# Patient Record
Sex: Male | Born: 1985 | Race: White | Hispanic: No | Marital: Married | Smoking: Never smoker
Health system: Southern US, Community
[De-identification: ages and names within clinical notes are randomized; demographics above are authoritative.]

## PROBLEM LIST (undated history)

## (undated) DIAGNOSIS — G4730 Sleep apnea, unspecified: Secondary | ICD-10-CM

## (undated) DIAGNOSIS — T7840XA Allergy, unspecified, initial encounter: Secondary | ICD-10-CM

## (undated) DIAGNOSIS — I10 Essential (primary) hypertension: Secondary | ICD-10-CM

## (undated) DIAGNOSIS — Z8489 Family history of other specified conditions: Secondary | ICD-10-CM

## (undated) DIAGNOSIS — F419 Anxiety disorder, unspecified: Secondary | ICD-10-CM

## (undated) DIAGNOSIS — F411 Generalized anxiety disorder: Secondary | ICD-10-CM

## (undated) HISTORY — DX: Anxiety disorder, unspecified: F41.9

## (undated) HISTORY — PX: WISDOM TOOTH EXTRACTION: SHX21

## (undated) HISTORY — PX: TYMPANOSTOMY TUBE PLACEMENT: SHX32

## (undated) HISTORY — DX: Allergy, unspecified, initial encounter: T78.40XA

## (undated) HISTORY — PX: EAR TUBE REMOVAL: SHX1486

## (undated) HISTORY — DX: Essential (primary) hypertension: I10

## (undated) HISTORY — PX: ADENOIDECTOMY: SUR15

---
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
## 2013-10-04 DIAGNOSIS — F32A Depression, unspecified: Secondary | ICD-10-CM | POA: Insufficient documentation

## 2013-10-04 DIAGNOSIS — F411 Generalized anxiety disorder: Secondary | ICD-10-CM | POA: Insufficient documentation

## 2020-05-10 ENCOUNTER — Other Ambulatory Visit: Payer: Self-pay

## 2020-05-17 ENCOUNTER — Other Ambulatory Visit: Payer: Self-pay | Admitting: Otolaryngology

## 2020-05-21 LAB — SURGICAL PATHOLOGY

## 2020-06-11 DIAGNOSIS — M25512 Pain in left shoulder: Secondary | ICD-10-CM | POA: Diagnosis not present

## 2020-06-11 DIAGNOSIS — M25511 Pain in right shoulder: Secondary | ICD-10-CM | POA: Diagnosis not present

## 2020-06-19 DIAGNOSIS — J301 Allergic rhinitis due to pollen: Secondary | ICD-10-CM | POA: Diagnosis not present

## 2020-06-22 DIAGNOSIS — J301 Allergic rhinitis due to pollen: Secondary | ICD-10-CM | POA: Diagnosis not present

## 2020-06-26 DIAGNOSIS — M25512 Pain in left shoulder: Secondary | ICD-10-CM | POA: Diagnosis not present

## 2020-06-26 DIAGNOSIS — M25511 Pain in right shoulder: Secondary | ICD-10-CM | POA: Diagnosis not present

## 2020-06-28 DIAGNOSIS — J301 Allergic rhinitis due to pollen: Secondary | ICD-10-CM | POA: Diagnosis not present

## 2020-07-02 DIAGNOSIS — J301 Allergic rhinitis due to pollen: Secondary | ICD-10-CM | POA: Diagnosis not present

## 2020-07-05 DIAGNOSIS — J301 Allergic rhinitis due to pollen: Secondary | ICD-10-CM | POA: Diagnosis not present

## 2020-07-09 DIAGNOSIS — J301 Allergic rhinitis due to pollen: Secondary | ICD-10-CM | POA: Diagnosis not present

## 2020-07-10 DIAGNOSIS — J301 Allergic rhinitis due to pollen: Secondary | ICD-10-CM | POA: Diagnosis not present

## 2020-07-12 DIAGNOSIS — J301 Allergic rhinitis due to pollen: Secondary | ICD-10-CM | POA: Diagnosis not present

## 2020-07-16 DIAGNOSIS — M25311 Other instability, right shoulder: Secondary | ICD-10-CM | POA: Diagnosis not present

## 2020-07-16 DIAGNOSIS — M7522 Bicipital tendinitis, left shoulder: Secondary | ICD-10-CM | POA: Diagnosis not present

## 2020-07-19 DIAGNOSIS — J301 Allergic rhinitis due to pollen: Secondary | ICD-10-CM | POA: Diagnosis not present

## 2020-07-23 DIAGNOSIS — J301 Allergic rhinitis due to pollen: Secondary | ICD-10-CM | POA: Diagnosis not present

## 2020-07-26 DIAGNOSIS — J301 Allergic rhinitis due to pollen: Secondary | ICD-10-CM | POA: Diagnosis not present

## 2020-07-30 ENCOUNTER — Encounter: Payer: Self-pay | Admitting: Emergency Medicine

## 2020-07-30 ENCOUNTER — Other Ambulatory Visit: Payer: Self-pay

## 2020-07-30 ENCOUNTER — Emergency Department
Admission: EM | Admit: 2020-07-30 | Discharge: 2020-07-30 | Disposition: A | Discharge status: Home / Self Care | Payer: Federal, State, Local not specified - PPO | Attending: Emergency Medicine | Admitting: Emergency Medicine

## 2020-07-30 DIAGNOSIS — R42 Dizziness and giddiness: Secondary | ICD-10-CM | POA: Diagnosis not present

## 2020-07-30 DIAGNOSIS — R0989 Other specified symptoms and signs involving the circulatory and respiratory systems: Secondary | ICD-10-CM | POA: Diagnosis not present

## 2020-07-30 DIAGNOSIS — R198 Other specified symptoms and signs involving the digestive system and abdomen: Secondary | ICD-10-CM

## 2020-07-30 DIAGNOSIS — R21 Rash and other nonspecific skin eruption: Secondary | ICD-10-CM | POA: Diagnosis not present

## 2020-07-30 DIAGNOSIS — L259 Unspecified contact dermatitis, unspecified cause: Secondary | ICD-10-CM | POA: Diagnosis not present

## 2020-07-30 HISTORY — DX: Allergy, unspecified, initial encounter: T78.40XA

## 2020-07-30 HISTORY — DX: Generalized anxiety disorder: F41.1

## 2020-07-30 LAB — CBC
HCT: 42.5 % (ref 39.0–52.0)
Hemoglobin: 14.7 g/dL (ref 13.0–17.0)
MCH: 29.6 pg (ref 26.0–34.0)
MCHC: 34.6 g/dL (ref 30.0–36.0)
MCV: 85.5 fL (ref 80.0–100.0)
Platelets: 360 10*3/uL (ref 150–400)
RBC: 4.97 MIL/uL (ref 4.22–5.81)
RDW: 12.7 % (ref 11.5–15.5)
WBC: 12.5 10*3/uL — ABNORMAL HIGH (ref 4.0–10.5)
nRBC: 0 % (ref 0.0–0.2)

## 2020-07-30 LAB — BASIC METABOLIC PANEL
Anion gap: 6 (ref 5–15)
BUN: 10 mg/dL (ref 6–20)
CO2: 27 mmol/L (ref 22–32)
Calcium: 8.9 mg/dL (ref 8.9–10.3)
Chloride: 105 mmol/L (ref 98–111)
Creatinine, Ser: 0.89 mg/dL (ref 0.61–1.24)
GFR, Estimated: >60 mL/min (ref >60)
Glucose, Bld: 99 mg/dL (ref 70–99)
Potassium: 3.8 mmol/L (ref 3.5–5.1)
Sodium: 138 mmol/L (ref 135–145)

## 2020-07-30 MED ORDER — CETIRIZINE HCL 10 MG PO TABS: 10.0000 mg | ORAL_TABLET | Freq: Every day | ORAL | 2 refills | Status: DC

## 2020-07-30 MED ORDER — OMEPRAZOLE 20 MG PO CPDR: 20.0000 mg | DELAYED_RELEASE_CAPSULE | Freq: Every day | ORAL | 1 refills | Status: DC

## 2020-07-30 NOTE — ED Triage Notes (Signed)
Pt via POV from home. Pt recently moved to [facility] in July and since then has had problems with nasal congestion and allergies. In mid-August pt started allergy testing. Pt states that on Friday night pt noticed a rash on his R hip. Pt says since then he has had random areas of itchy rashes that would pop up for approx 20 mins and this weekend pt has been feeling lightheadness that started this weekend. Pt is A&Ox4 and NAD.

## 2020-07-30 NOTE — ED Provider Notes (Signed)
[facility] Emergency Department Provider Note   ____________________________________________   First MD Initiated Contact with Patient 07/30/20 1345     (approximate)  I have reviewed the triage vital signs and the nursing notes.   HISTORY  Chief Complaint Rash and Dizziness    HPI Richard Donovan is a 34 y.o. male with past medical history of seasonal allergies and generalized anxiety disorder who presents to the ED complaining of rash and lightheadedness. Patient reports that he has been dealing with about 1 week of intermittent areas of red itchy rash that can pop up on various parts of his body. The rash for started while he was vacationing in [place], since he has gotten home he has washed all of his clothes and showered multiple times. He continues to have spots of red itchy and raised rash, but spots are smaller than they had been. He does follow with an allergist for similar allergic reactions and they have told him it could be a contact dermatitis but they are unsure of his trigger. He additionally reports a feeling of swelling in the back of his throat that has been ongoing for about 1 month. He states he has to work harder than usual in order to swallow and feels like something is stuck in the back of his throat. He denies any difficulty breathing and has been tolerating liquids and solids without difficulty. He has seen an ENT for this problem with unremarkable work-up, has never been seen by GI. His wife does state that he seems to have breath-holding episodes at night. He additionally has been feeling increasingly lightheaded and dizzy over the past couple of days, but denies any chest pain or shortness of breath.        Past Medical History:  Diagnosis Date  . Allergies   . Generalized anxiety disorder     There are no problems to display for this patient.   Past Surgical History:  Procedure Laterality Date  . ADENOIDECTOMY      Prior to  Admission medications   Medication Sig Start Date End Date Taking? Authorizing Provider  cetirizine (ZYRTEC ALLERGY) 10 MG tablet Take 1 tablet (10 mg total) by mouth daily. 07/30/20 07/30/21  Chesley Noon, MD  omeprazole (PRILOSEC) 20 MG capsule Take 1 capsule (20 mg total) by mouth daily. 07/30/20 07/30/21  Chesley Noon, MD    Allergies Sudafed [pseudoephedrine]  No family history on file.  Social History Social History   Tobacco Use  . Smoking status: Never Smoker  . Smokeless tobacco: Never Used  Vaping Use  . Vaping Use: Never used  Substance Use Topics  . Alcohol use: Not Currently  . Drug use: Not Currently    Review of Systems  Constitutional: No fever/chills Eyes: No visual changes. ENT: No sore throat. Cardiovascular: Denies chest pain. Respiratory: Denies shortness of breath. Gastrointestinal: No abdominal pain.  No nausea, no vomiting.  No diarrhea.  No constipation. Positive for difficulty swallowing. Genitourinary: Negative for dysuria. Musculoskeletal: Negative for back pain. Skin: Positive for rash. Neurological: Negative for headaches, focal weakness or numbness.  ____________________________________________   PHYSICAL EXAM:  VITAL SIGNS: ED Triage Vitals  Enc Vitals Group     BP 07/30/20 1304 (!) 141/85     Pulse Rate 07/30/20 1304 83     Resp 07/30/20 1304 20     Temp 07/30/20 1304 98.9 F (37.2 C)     Temp Source 07/30/20 1304 Oral     SpO2 07/30/20 1304  99 %     Weight 07/30/20 1305 230 lb (104.3 kg)     Height 07/30/20 1305 6\' 1"  (1.854 m)     Head Circumference --      Peak Flow --      Pain Score 07/30/20 1305 0     Pain Loc --      Pain Edu? --      Excl. in GC? --     Constitutional: Alert and oriented. Eyes: Conjunctivae are normal. Head: Atraumatic. Nose: No congestion/rhinnorhea. Mouth/Throat: Mucous membranes are moist. Posterior oropharynx clear with no erythema or edema. Neck: Normal ROM Cardiovascular: Normal  rate, regular rhythm. Grossly normal heart sounds. Respiratory: Normal respiratory effort.  No retractions. Lungs CTAB. Gastrointestinal: Soft and nontender. No distention. Genitourinary: deferred Musculoskeletal: No lower extremity tenderness nor edema. Neurologic:  Normal speech and language. No gross focal neurologic deficits are appreciated. Skin:  Skin is warm, dry and intact. Small scattered areas of maculopapular erythematous rash to extremities. Psychiatric: Mood and affect are normal. Speech and behavior are normal.  ____________________________________________   LABS (all labs ordered are listed, but only abnormal results are displayed)  Labs Reviewed  CBC - Abnormal; Notable for the following components:      Result Value   WBC 12.5 (*)    All other components within normal limits  BASIC METABOLIC PANEL  URINALYSIS, COMPLETE (UACMP) WITH MICROSCOPIC   ____________________________________________  EKG  ED ECG REPORT I, 08/01/20, the attending physician, personally viewed and interpreted this ECG.   Date: 07/30/2020  EKG Time: 13:08  Rate: 77  Rhythm: normal sinus rhythm  Axis: RAD  Intervals:none  ST&T Change: Nonspecific T wave changes   PROCEDURES  Procedure(s) performed (including Critical Care):  Procedures   ____________________________________________   INITIAL IMPRESSION / ASSESSMENT AND PLAN / ED COURSE       34 year old male with past medical history of generalized anxiety disorder and seasonal allergies who presents to the ED complaining of intermittent rashes for the past week, feeling of something stuck in the back of his throat for the past month, and lightheadedness with dizziness for the past few days. EKG shows no evidence of arrhythmia or ischemia, labs unremarkable with no anemia or electrolyte abnormality. I doubt primary cardiac process for his dizziness and there does appear to be a component of anxiety. His posterior oropharynx  is clear and he is tolerating secretions without difficulty. He may have a component of reflux or OSA contributing to describe globus sensation. We will have him follow-up with his PCP for sleep study, start him on a PPI, and provide referral to GI. He likely has a contact dermatitis explaining his rash, we will start him on cetirizine as the Benadryl he has been taking could be contributing to lightheadedness. He is appropriate for discharge home with dermatology follow-up. He was counseled to return to the ED for new worsening symptoms, patient agrees with plan.      ____________________________________________   FINAL CLINICAL IMPRESSION(S) / ED DIAGNOSES  Final diagnoses:  Contact dermatitis, unspecified contact dermatitis type, unspecified trigger  Globus sensation     ED Discharge Orders         Ordered    omeprazole (PRILOSEC) 20 MG capsule  Daily        07/30/20 1414    cetirizine (ZYRTEC ALLERGY) 10 MG tablet  Daily        07/30/20 1414           Note:  This  document was prepared using Conservation officer, historic buildings and may include unintentional dictation errors.   Chesley Noon, MD 07/30/20 1420

## 2020-08-01 DIAGNOSIS — K219 Gastro-esophageal reflux disease without esophagitis: Secondary | ICD-10-CM | POA: Diagnosis not present

## 2020-08-07 ENCOUNTER — Ambulatory Visit: Payer: Federal, State, Local not specified - PPO

## 2020-09-17 DIAGNOSIS — Z1152 Encounter for screening for COVID-19: Secondary | ICD-10-CM | POA: Diagnosis not present

## 2020-11-26 DIAGNOSIS — G4733 Obstructive sleep apnea (adult) (pediatric): Secondary | ICD-10-CM | POA: Diagnosis not present

## 2020-11-27 DIAGNOSIS — G4733 Obstructive sleep apnea (adult) (pediatric): Secondary | ICD-10-CM | POA: Diagnosis not present

## 2020-12-27 DIAGNOSIS — G4733 Obstructive sleep apnea (adult) (pediatric): Secondary | ICD-10-CM | POA: Diagnosis not present

## 2021-01-26 DIAGNOSIS — G4733 Obstructive sleep apnea (adult) (pediatric): Secondary | ICD-10-CM | POA: Diagnosis not present

## 2021-02-26 DIAGNOSIS — G4733 Obstructive sleep apnea (adult) (pediatric): Secondary | ICD-10-CM | POA: Diagnosis not present

## 2021-03-28 DIAGNOSIS — G4733 Obstructive sleep apnea (adult) (pediatric): Secondary | ICD-10-CM | POA: Diagnosis not present

## 2021-04-03 DIAGNOSIS — G4733 Obstructive sleep apnea (adult) (pediatric): Secondary | ICD-10-CM | POA: Diagnosis not present

## 2021-04-28 DIAGNOSIS — G4733 Obstructive sleep apnea (adult) (pediatric): Secondary | ICD-10-CM | POA: Diagnosis not present

## 2021-05-29 DIAGNOSIS — G4733 Obstructive sleep apnea (adult) (pediatric): Secondary | ICD-10-CM | POA: Diagnosis not present

## 2021-06-14 DIAGNOSIS — G4733 Obstructive sleep apnea (adult) (pediatric): Secondary | ICD-10-CM | POA: Diagnosis not present

## 2021-06-14 DIAGNOSIS — J301 Allergic rhinitis due to pollen: Secondary | ICD-10-CM | POA: Diagnosis not present

## 2021-06-14 DIAGNOSIS — J3489 Other specified disorders of nose and nasal sinuses: Secondary | ICD-10-CM | POA: Diagnosis not present

## 2021-06-14 DIAGNOSIS — J320 Chronic maxillary sinusitis: Secondary | ICD-10-CM | POA: Diagnosis not present

## 2021-06-17 ENCOUNTER — Encounter: Payer: Self-pay | Admitting: Otolaryngology

## 2021-06-20 NOTE — Discharge Instructions (Signed)
[facility] ENDOSCOPIC SINUS SURGERY [facility]  What is Functional Endoscopic Sinus Surgery?  The Surgery involves making the natural openings of the sinuses larger by removing the bony partitions that separate the sinuses from the nasal cavity.  The natural sinus lining is preserved as much as possible to allow the sinuses to resume normal function after the surgery.  In some patients nasal polyps (excessively swollen lining of the sinuses) may be removed to relieve obstruction of the sinus openings.  The surgery is performed through the nose using lighted scopes, which eliminates the need for incisions on the face.  A septoplasty is a different procedure which is sometimes performed with sinus surgery.  It involves straightening the boy partition that separates the two sides of your nose.  A crooked or deviated septum may need repair if is obstructing the sinuses or nasal airflow.  Turbinate reduction is also often performed during sinus surgery.  The turbinates are bony proturberances from the side walls of the nose which swell and can obstruct the nose in patients with sinus and allergy problems.  Their size can be surgically reduced to help relieve nasal obstruction.  What Can Sinus Surgery Do For Me?  Sinus surgery can reduce the frequency of sinus infections requiring antibiotic treatment.  This can provide improvement in nasal congestion, post-nasal drainage, facial pressure and nasal obstruction.  Surgery will NOT prevent you from ever having an infection again, so it usually only for patients who get infections 4 or more times yearly requiring antibiotics, or for infections that do not clear with antibiotics.  It will not cure nasal allergies, so patients with allergies may still require medication to treat their allergies after surgery. Surgery may improve headaches related to sinusitis, however, some people will continue to  require medication to control sinus headaches related to allergies.  Surgery will do nothing for other forms of headache (migraine, tension or cluster).  What Are the Risks of Endoscopic Sinus Surgery?  Current techniques allow surgery to be performed safely with little risk, however, there are rare complications that patients should be aware of.  Because the sinuses are located around the eyes, there is risk of eye injury, including blindness, though again, this would be quite rare. This is usually a result of bleeding behind the eye during surgery, which can effect vision, though there are treatments to protect the vision and prevent permanent injury. More serious complications would include bleeding inside the brain cavity or damage to the brain.This happens when the fluid around the brain leaks out into the sinus cavity.  Again, all of these complications are uncommon, and spinal fluid leaks can be safely managed surgically if they occur.  The most common complication of sinus surgery is bleeding from the nose, which may require packing or cauterization of the nose.  Patients with polyps may experience recurrence of the polyps that would require revision surgery.  Alterations of sense of smell or injury to the tear ducts are also rare complications.   What is the Surgery Like, and what is the Recovery?  The Surgery usually takes a couple of hours to perform, and is usually performed under a general anesthetic (completely asleep).  Patients are usually discharged home after a couple of hours.  Sometimes during surgery it is necessary to pack the nose to control bleeding, and the packing is left in place for 24 - 48 hours, and removed by your surgeon.  If   a septoplasty was performed during the procedure, there is often a splint placed which must be removed after 5-7 days.   Discomfort: Pain is usually mild to moderate, and can be controlled by prescription pain medication or acetaminophen (Tylenol).   Aspirin, Ibuprofen (Advil, Motrin), or Naprosyn (Aleve) should be avoided, as they can cause increased bleeding.  Most patients feel sinus pressure like they have a bad head cold for several days.  Sleeping with your head elevated can help reduce swelling and facial pressure, as can ice packs over the face.  A humidifier may be helpful to keep the mucous and blood from drying in the nose.   Diet: There are no specific diet restrictions, however, you should generally start with clear liquids and a light diet of bland foods because the anesthetic can cause some nausea.  Advance your diet depending on how your stomach feels.  Taking your pain medication with food will often help reduce stomach upset which pain medications can cause.  Nasal Saline Irrigation: It is important to remove blood clots and dried mucous from the nose as it is healing.  This is done by having you irrigate the nose at least 3 - 4 times daily with a salt water solution.  We recommend using NeilMed Sinus Rinse (available at the drug store).  Fill the squeeze bottle with the solution, bend over a sink, and insert the tip of the squeeze bottle into the nose  of an inch.  Point the tip of the squeeze bottle towards the inside corner of the eye on the same side your irrigating.  Squeeze the bottle and gently irrigate the nose.  If you bend forward as you do this, most of the fluid will flow back out of the nose, instead of down your throat.   The solution should be warm, near body temperature, when you irrigate.   Each time you irrigate, you should use a full squeeze bottle.   Note that if you are instructed to use Nasal Steroid Sprays at any time after your surgery, irrigate with saline BEFORE using the steroid spray, so you do not wash it all out of the nose. Another product, Nasal Saline Gel (such as AYR Nasal Saline Gel) can be applied in each nostril 3 - 4 times daily to moisture the nose and reduce scabbing or crusting.  Bleeding:   Bloody drainage from the nose can be expected for several days, and patients are instructed to irrigate their nose frequently with salt water to help remove mucous and blood clots.  The drainage may be dark red or brown, though some fresh blood may be seen intermittently, especially after irrigation.  Do not blow you nose, as bleeding may occur. If you must sneeze, keep your mouth open to allow air to escape through your mouth.  If heavy bleeding occurs: Irrigate the nose with saline to rinse out clots, then spray the nose 3 - 4 times with Afrin Nasal Decongestant Spray.  The spray will constrict the blood vessels to slow bleeding.  Pinch the lower half of your nose shut to apply pressure, and lay down with your head elevated.  Ice packs over the nose may help as well. If bleeding persists despite these measures, you should notify your doctor.  Do not use the Afrin routinely to control nasal congestion after surgery, as it can result in worsening congestion and may affect healing.     Activity: Return to work varies among patients. Most patients will be out   of work at least 5 - 7 days to recover.  Patient may return to work after they are off of narcotic pain medication, and feeling well enough to perform the functions of their job.  Patients must avoid heavy lifting (over 10 pounds) or strenuous physical for 2 weeks after surgery, so your employer may need to assign you to light duty, or keep you out of work longer if light duty is not possible.  NOTE: you should not drive, operate dangerous machinery, do any mentally demanding tasks or make any important legal or financial decisions while on narcotic pain medication and recovering from the general anesthetic.    Call Your Doctor Immediately if You Have Any of the Following: Bleeding that you cannot control with the above measures Loss of vision, double vision, bulging of the eye or black eyes. Fever over 101 degrees Neck stiffness with severe headache,  fever, nausea and change in mental state. You are always encouraged to call anytime with concerns, however, please call with requests for pain medication refills during office hours.  Office Endoscopy: During follow-up visits your doctor will remove any packing or splints that may have been placed and evaluate and clean your sinuses endoscopically.  Topical anesthetic will be used to make this as comfortable as possible, though you may want to take your pain medication prior to the visit.  How often this will need to be done varies from patient to patient.  After complete recovery from the surgery, you may need follow-up endoscopy from time to time, particularly if there is concern of recurrent infection or nasal polyps.  

## 2021-06-26 ENCOUNTER — Other Ambulatory Visit: Payer: Self-pay

## 2021-06-26 ENCOUNTER — Encounter: Payer: Self-pay | Admitting: Otolaryngology

## 2021-06-26 ENCOUNTER — Ambulatory Visit
Admission: RE | Admit: 2021-06-26 | Discharge: 2021-06-26 | Disposition: A | Discharge status: Home / Self Care | Payer: Federal, State, Local not specified - PPO | Attending: Otolaryngology | Admitting: Otolaryngology

## 2021-06-26 ENCOUNTER — Ambulatory Visit: Payer: Federal, State, Local not specified - PPO | Admitting: Anesthesiology

## 2021-06-26 ENCOUNTER — Encounter
Admission: RE | Disposition: A | Discharge status: Home / Self Care | Payer: Self-pay | Source: Home / Self Care | Attending: Otolaryngology

## 2021-06-26 DIAGNOSIS — J343 Hypertrophy of nasal turbinates: Secondary | ICD-10-CM | POA: Diagnosis not present

## 2021-06-26 DIAGNOSIS — J329 Chronic sinusitis, unspecified: Secondary | ICD-10-CM | POA: Diagnosis not present

## 2021-06-26 DIAGNOSIS — J320 Chronic maxillary sinusitis: Secondary | ICD-10-CM | POA: Diagnosis not present

## 2021-06-26 DIAGNOSIS — J321 Chronic frontal sinusitis: Secondary | ICD-10-CM | POA: Diagnosis not present

## 2021-06-26 DIAGNOSIS — J342 Deviated nasal septum: Secondary | ICD-10-CM | POA: Insufficient documentation

## 2021-06-26 DIAGNOSIS — J3489 Other specified disorders of nose and nasal sinuses: Secondary | ICD-10-CM | POA: Diagnosis not present

## 2021-06-26 DIAGNOSIS — J322 Chronic ethmoidal sinusitis: Secondary | ICD-10-CM | POA: Diagnosis not present

## 2021-06-26 HISTORY — PX: MAXILLARY ANTROSTOMY: SHX2003

## 2021-06-26 HISTORY — PX: NASAL SEPTOPLASTY W/ TURBINOPLASTY: SHX2070

## 2021-06-26 HISTORY — DX: Sleep apnea, unspecified: G47.30

## 2021-06-26 HISTORY — DX: Family history of other specified conditions: Z84.89

## 2021-06-26 HISTORY — PX: FRONTAL SINUS EXPLORATION: SHX6591

## 2021-06-26 HISTORY — PX: IMAGE GUIDED SINUS SURGERY: SHX6570

## 2021-06-26 HISTORY — PX: ETHMOIDECTOMY: SHX5197

## 2021-06-26 SURGERY — SINUS SURGERY, WITH IMAGING GUIDANCE
Anesthesia: General | Site: Nose

## 2021-06-26 MED ORDER — LIDOCAINE-EPINEPHRINE 1 %-1:100000 IJ SOLN
INTRAMUSCULAR | Status: DC | PRN
  Administered 2021-06-26: 8 mL

## 2021-06-26 MED ORDER — FENTANYL CITRATE (PF) 100 MCG/2ML IJ SOLN
INTRAMUSCULAR | Status: DC | PRN
  Administered 2021-06-26 (×2): 50 ug via INTRAVENOUS
  Administered 2021-06-26: 100 ug via INTRAVENOUS

## 2021-06-26 MED ORDER — DEXMEDETOMIDINE (PRECEDEX) IN NS 20 MCG/5ML (4 MCG/ML) IV SYRINGE
PREFILLED_SYRINGE | INTRAVENOUS | Status: DC | PRN
  Administered 2021-06-26: 10 ug via INTRAVENOUS

## 2021-06-26 MED ORDER — LACTATED RINGERS IV SOLN: INTRAVENOUS | Status: DC | PRN

## 2021-06-26 MED ORDER — OXYCODONE HCL 5 MG/5ML PO SOLN
5.0000 mg | Freq: Once | ORAL | Status: AC | PRN
  Administered 2021-06-26: 5 mg via ORAL

## 2021-06-26 MED ORDER — OXYMETAZOLINE HCL 0.05 % NA SOLN
NASAL | Status: DC | PRN
  Administered 2021-06-26: 1 via TOPICAL

## 2021-06-26 MED ORDER — ACETAMINOPHEN 10 MG/ML IV SOLN
1000.0000 mg | Freq: Once | INTRAVENOUS | Status: AC
  Administered 2021-06-26: 1000 mg via INTRAVENOUS

## 2021-06-26 MED ORDER — DEXAMETHASONE SODIUM PHOSPHATE 4 MG/ML IJ SOLN
INTRAMUSCULAR | Status: DC | PRN
  Administered 2021-06-26: 10 mg via INTRAVENOUS

## 2021-06-26 MED ORDER — FENTANYL CITRATE PF 50 MCG/ML IJ SOSY: 25.0000 ug | PREFILLED_SYRINGE | INTRAMUSCULAR | Status: DC | PRN

## 2021-06-26 MED ORDER — HYDROCODONE-ACETAMINOPHEN 5-325 MG PO TABS: 1.0000 | ORAL_TABLET | ORAL | 0 refills | Status: DC | PRN

## 2021-06-26 MED ORDER — ACETAMINOPHEN 500 MG PO TABS: 1000.0000 mg | ORAL_TABLET | Freq: Once | ORAL | Status: DC

## 2021-06-26 MED ORDER — 0.9 % SODIUM CHLORIDE (POUR BTL) OPTIME
TOPICAL | Status: DC | PRN
  Administered 2021-06-26: 500 mL

## 2021-06-26 MED ORDER — LIDOCAINE HCL (CARDIAC) PF 100 MG/5ML IV SOSY
PREFILLED_SYRINGE | INTRAVENOUS | Status: DC | PRN
  Administered 2021-06-26: 50 mg via INTRAVENOUS

## 2021-06-26 MED ORDER — MIDAZOLAM HCL 5 MG/5ML IJ SOLN
INTRAMUSCULAR | Status: DC | PRN
  Administered 2021-06-26: 2 mg via INTRAVENOUS

## 2021-06-26 MED ORDER — SCOPOLAMINE 1 MG/3DAYS TD PT72
1.0000 | MEDICATED_PATCH | Freq: Once | TRANSDERMAL | Status: DC
  Administered 2021-06-26: 1.5 mg via TRANSDERMAL

## 2021-06-26 MED ORDER — GLYCOPYRROLATE 0.2 MG/ML IJ SOLN
INTRAMUSCULAR | Status: DC | PRN
  Administered 2021-06-26: .1 mg via INTRAVENOUS

## 2021-06-26 MED ORDER — SULFAMETHOXAZOLE-TRIMETHOPRIM 800-160 MG PO TABS: 1.0000 | ORAL_TABLET | Freq: Two times a day (BID) | ORAL | 0 refills | Status: DC

## 2021-06-26 MED ORDER — ONDANSETRON HCL 4 MG PO TABS: 4.0000 mg | ORAL_TABLET | Freq: Three times a day (TID) | ORAL | 0 refills | Status: DC | PRN

## 2021-06-26 MED ORDER — ACETAMINOPHEN 160 MG/5ML PO SOLN: 960.0000 mg | Freq: Once | ORAL | Status: DC

## 2021-06-26 MED ORDER — EPHEDRINE SULFATE 50 MG/ML IJ SOLN
INTRAMUSCULAR | Status: DC | PRN
  Administered 2021-06-26 (×4): 5 mg via INTRAVENOUS
  Administered 2021-06-26 (×2): 10 mg via INTRAVENOUS
  Administered 2021-06-26: 5 mg via INTRAVENOUS

## 2021-06-26 MED ORDER — PROPOFOL 10 MG/ML IV BOLUS
INTRAVENOUS | Status: DC | PRN
  Administered 2021-06-26: 200 mg via INTRAVENOUS

## 2021-06-26 MED ORDER — ONDANSETRON HCL 4 MG/2ML IJ SOLN
4.0000 mg | Freq: Once | INTRAMUSCULAR | Status: AC | PRN
  Administered 2021-06-26: 4 mg via INTRAVENOUS

## 2021-06-26 MED ORDER — SUCCINYLCHOLINE CHLORIDE 200 MG/10ML IV SOSY
PREFILLED_SYRINGE | INTRAVENOUS | Status: DC | PRN
  Administered 2021-06-26: 100 mg via INTRAVENOUS

## 2021-06-26 MED ORDER — OXYCODONE HCL 5 MG PO TABS: 5.0000 mg | ORAL_TABLET | Freq: Once | ORAL | Status: AC | PRN

## 2021-06-26 SURGICAL SUPPLY — 37 items
BALLN SINUPLASTY KIT 6X16 (BALLOONS) ×5
BALLOON SINUPLASTY KIT 6X16 (BALLOONS) ×4 IMPLANT
BATTERY INSTRU NAVIGATION (MISCELLANEOUS) ×15 IMPLANT
BTRY SRG DRVR LF (MISCELLANEOUS) ×12
CANISTER SUCT 1200ML W/VALVE (MISCELLANEOUS) ×5 IMPLANT
COAG SUCT 10F 3.5MM HAND CTRL (MISCELLANEOUS) ×5 IMPLANT
DEVICE INFLATION SEID (MISCELLANEOUS) ×5 IMPLANT
DRESSING NASL FOAM PST OP SINU (MISCELLANEOUS) ×4 IMPLANT
DRSG NASAL FOAM POST OP SINU (MISCELLANEOUS) ×5
ELECT REM PT RETURN 9FT ADLT (ELECTROSURGICAL) ×5
ELECTRODE REM PT RTRN 9FT ADLT (ELECTROSURGICAL) ×4 IMPLANT
GLOVE SURG GAMMEX PI TX LF 7.5 (GLOVE) ×10 IMPLANT
GOWN STRL REUS W/ TWL LRG LVL3 (GOWN DISPOSABLE) ×4 IMPLANT
GOWN STRL REUS W/TWL LRG LVL3 (GOWN DISPOSABLE) ×5
IV NS 500ML (IV SOLUTION) ×5
IV NS 500ML BAXH (IV SOLUTION) ×4 IMPLANT
KIT TURNOVER KIT A (KITS) ×5 IMPLANT
NEEDLE HYPO 25GX1X1/2 BEV (NEEDLE) ×5 IMPLANT
NS IRRIG 500ML POUR BTL (IV SOLUTION) ×5 IMPLANT
PACK ENT CUSTOM (PACKS) ×5 IMPLANT
PACKING NASAL EPIS 4X2.4 XEROG (MISCELLANEOUS) ×5 IMPLANT
PATTIES SURGICAL .5 X3 (DISPOSABLE) ×5 IMPLANT
SHAVER DIEGO BLD STD TYPE A (BLADE) ×5 IMPLANT
SOL ANTI-FOG 6CC FOG-OUT (MISCELLANEOUS) ×4 IMPLANT
SOL FOG-OUT ANTI-FOG 6CC (MISCELLANEOUS) ×1
SPLINT NASAL SEPTAL BLV .50 ST (MISCELLANEOUS) ×5 IMPLANT
STRAP BODY AND KNEE 60X3 (MISCELLANEOUS) ×5 IMPLANT
SUT CHROMIC 4 0 RB 1X27 (SUTURE) ×5 IMPLANT
SUT ETHILON 3-0 FS-10 30 BLK (SUTURE) ×5
SUT PLAIN GUT 4-0 (SUTURE) ×5 IMPLANT
SUTURE EHLN 3-0 FS-10 30 BLK (SUTURE) ×4 IMPLANT
SYR 10ML LL (SYRINGE) ×5 IMPLANT
SYR EAR/ULCER 2OZ (SYRINGE) ×5 IMPLANT
TOWEL OR 17X26 4PK STRL BLUE (TOWEL DISPOSABLE) ×5 IMPLANT
TRACKER CRANIALMASK (MASK) ×5 IMPLANT
TUBING DECLOG MULTIDEBRIDER (TUBING) ×5 IMPLANT
WATER STERILE IRR 250ML POUR (IV SOLUTION) IMPLANT

## 2021-06-26 NOTE — Anesthesia Postprocedure Evaluation (Signed)
Anesthesia Post Note  Patient: Richard Donovan  Procedure(s) Performed: IMAGE GUIDED SINUS SURGERY (Nose) NASAL SEPTOPLASTY WITH INFERIOR TURBINATE REDUCTION (Bilateral: Nose) MAXILLARY ANTROSTOMY WITH TISSUE REMOVAL (Bilateral: Face) FRONTAL SINUS EXPLORATION (Left: Nose) ANTERIOR ETHMOIDECTOMY (Left: Nose)     Patient location during evaluation: PACU Anesthesia Type: General Level of consciousness: awake and alert Pain management: pain level controlled Vital Signs Assessment: post-procedure vital signs reviewed and stable Respiratory status: spontaneous breathing Cardiovascular status: blood pressure returned to baseline Postop Assessment: no apparent nausea or vomiting Anesthetic complications: no   No notable events documented.  Nesiah Jump [employer]

## 2021-06-26 NOTE — Op Note (Signed)
..06/26/2021  10:23 AM    [name]  [id number]    Pre-Op Dx:  Deviated Nasal Septum, Hypertrophic Inferior Turbinates, Chronic maxillary, left ethmoid, left frontal sinusitis  Post-op Dx: Same  Proc: 1)    Bilateral Maxillary antrostomy with tissue removal 2)  Left anterior ethmoidectomy 3)  Left frontal sinusotomy 4)  Nasal Septoplasty 5)  Bilateral Partial Reduction Inferior Turbinates  6)  IGSS  Surg:  Hoang Reich  Anes:  GOT  EBL:  50  Comp:  none  Findings: Bilateral anterior inferior nasal septal swelling and deviation, left sided mid septal deviation with impaction onto inferior turbinate and lateral nasal side wall.  Bilateral large accessory ostia.  Successful left anterior ethmoidectomy and frontal sinus sinusotomy  Procedure: With the patient in a comfortable supine position,  general orotracheal anesthesia was induced without difficulty.  The patient received preoperative Afrin spray for topical decongestion and vasoconstriction.  At an appropriate level, the patient was placed in a semi-sitting position.  Nasal vibrissae were trimmed.   1% Xylocaine with 1:100,000 epinephrine, 8 cc's, was infiltrated into the anterior floor of the nose, into the nasal spine region, into the membranous columella, and finally into the submucoperichondrial plane of the septum on both sides.  Several minutes were allowed for this to take effect.  Cottoniod pledgetts soaked in Afrin were placed into both nasal cavities and left while the patient was prepped and draped in the standard fashion.   A proper time-out was performed.  The Stryker image guidance system was set up and calibrated in the normal fashion with an acceptable error of 0.69mm.   The materials were removed from the nose and observed to be intact and correct in number.  The nose was inspected with a headlight and zero degree endoscope with the findings as described above.  A left Killian incision was sharply  executed and carried down to the caudal edge of the quadrangular cartilage with a 15 blade scapel.  A mucoperichondrial flap was elelvated along the quadrangular plate back to the bony-cartilaginous junction using caudal elevator and freer elevator. The mucoperiostium was then elevated along the ethmoid plate and the vomer. An intracartilagenous incision was made using the freer elevator and a contralateral mucoperichondiral flap was elevated using a freer elevator.  Care was taken to avoid any large rents or opposing rents in the mucoperichondrial flap.  Boney spurs of the vomer and maxillary crest were removed with Takahashi forceps.  The area of cartilagenous deviation was removed with combination of freer elevator and Takahashi forceps creating a widely patent nasal cavity as well as resolution of obstruction from the cartilagenous deviation. The mucosal flaps were placed back into their anatomic position to allow visualization of the airways. The septum now sat in the midline with an improved airway.  A 4-0 Chromic was used to close the Midland incision as well.   The inferior turbinates were then inspected.  Under endoscopic visualization, the inferior turbinates were infractured bilaterally with a Therapist, nutritional.  A kelly clamp was attached to the anterior-inferior third of each inferior turbinate for approximately one minute.  Under endoscopic visualization, Tru-cutting forceps were used to remove the anterior-inferior third of each inferior turbinate.  Electrocautery was used to control bleeding in the area. The remaining turbinate was then outfractured to open up the airway further. There was no significant bleeding noted. The right turbinate was then trimmed and outfractured in a similar fashion.  The airways were then visualized and showed open passageways  on both sides that were significantly improved compared to before surgery.       At this point, attention was directed to the functional  endoscopic sinus surgery aspect of the procedure.  The nose was next inspected with a zero degree endoscope and the middle turbinates were medialized and afrin soaked pledgets were placed lateral to the turbinates for approximately one minute.  The patient's bilateral uncinate process was infractured with a maxillary ostia seeker.  One the left  The Acclarent balloon sinuplasty device was next placed behind the uncinate process and the guide wire was threaded into the frontal sinus with proper transillumination.  The sinus tract was then dilated 3X for a patent frontal sinus.  At this time a currette and Giraffe probe was used to remove the fractured ager nasi cells for a widely patient frontal sinus outflow tract. This was evaluated with 30 degree endoscope and fragements of residual ager nasi cells were removed with 45 degree up-biting forceps.  At this time attention was directed to the patient's maxillary sinuses.  On the left, a ball tipped probe was placed through the natural ostia and this was used to create a larger opening.  Using a Diego microdebrider, the maxillary antrostomy was enlarged for a widely patent maxillary antrostomy.  This was connected to a large accessory ostia more posterior and inferior along the medial maxillary wall.  This was repeated in a simlar fashion on the patient's right side.  Hemostasis was performed with topical Afrin soaked pledgets.  Visualization with a zero and 30 degree endoscope was used to examine the bilateral maxillary antrostomies which were noted to be widely patent and in continuity with the natural os bilaterally.  Next attention was directed to the patient's ethmoid sinuses.  The left ethmoid bulla was entered with a straight image guidance suction.  The ethmoid cells were opened from a medial and inferior position superior and laterally until the vertical lamella was encountered.  All fragments of bone and mucosa were removed with straight biting forceps.   Image guidance was used throughout to ensure all diseased air cells were opened.    At this time with all diseased sinuses opened, the patient's nasal cavity was examinated and copiously irrigated with sterile saline.  Meticulous hemostasis was continued and all sinuses were examined and noted to be widely patent.    There was no signifcant bleeding. Nasal splints were applied to both sides of the septum using Xomed 0.60mm regular sized splints that were trimmed, and then held in position with a 3-0 Nylon through and through suture.  Stamberger sinufoam was placed along the cut edge of the inferior turbinates bilaterally.  Xerogel was placed lateral to the middle turbinates bilaterally.  The patient was turned back over to anesthesia, and awakened, extubated, and taken to the PACU in satisfactory condition.  Dispo:   PACU to home  Plan: Ice, elevation, narcotic analgesia, and prophylactic antibiotics for the duration of indwelling nasal foreign bodies.  We will reevaluate the patient in the office in 6 days and remove the septal splints.  Return to work in 10 days, strenuous activities in two weeks.   Lace Chenevert 06/26/2021 10:23 AM

## 2021-06-26 NOTE — Transfer of Care (Signed)
Immediate Anesthesia Transfer of Care Note  Patient: Richard Donovan  Procedure(s) Performed: IMAGE GUIDED SINUS SURGERY (Nose) NASAL SEPTOPLASTY WITH INFERIOR TURBINATE REDUCTION (Bilateral: Nose) MAXILLARY ANTROSTOMY WITH TISSUE REMOVAL (Bilateral: Face) FRONTAL SINUS EXPLORATION (Left: Nose) ANTERIOR ETHMOIDECTOMY (Left: Nose)  Patient Location: PACU  Anesthesia Type: General  Level of Consciousness: awake, alert  and patient cooperative  Airway and Oxygen Therapy: Patient Spontanous Breathing and Patient connected to supplemental oxygen  Post-op Assessment: Post-op Vital signs reviewed, Patient's Cardiovascular Status Stable, Respiratory Function Stable, Patent Airway and No signs of Nausea or vomiting  Post-op Vital Signs: Reviewed and stable  Complications: No notable events documented.

## 2021-06-26 NOTE — Anesthesia Preprocedure Evaluation (Addendum)
Anesthesia Evaluation  Patient identified by MRN, date of birth, ID band Patient awake    Reviewed: Allergy & Precautions, NPO status , Patient's Chart, lab work & pertinent test results  Airway Mallampati: II  TM Distance: >3 FB Neck ROM: Full    Dental no notable dental hx.    Pulmonary sleep apnea ,    Pulmonary exam normal        Cardiovascular Exercise Tolerance: Good negative cardio ROS Normal cardiovascular exam     Neuro/Psych Anxiety negative neurological ROS     GI/Hepatic Neg liver ROS, GERD  Medicated and Controlled,  Endo/Other  BMI 31  Renal/GU negative Renal ROS     Musculoskeletal negative musculoskeletal ROS (+)   Abdominal (+) + obese,   Peds  Hematology negative hematology ROS (+)   Anesthesia Other Findings Chronic sinusitis  Reproductive/Obstetrics                            Anesthesia Physical Anesthesia Plan  ASA: 2  Anesthesia Plan: General   Post-op Pain Management:    Induction: Intravenous  PONV Risk Score and Plan: 2 and Ondansetron, Midazolam and Treatment may vary due to age or medical condition  Airway Management Planned: Oral ETT  Additional Equipment: None  Intra-op Plan:   Post-operative Plan: Extubation in OR  Informed Consent: I have reviewed the patients History and Physical, chart, labs and discussed the procedure including the risks, benefits and alternatives for the proposed anesthesia with the patient or authorized representative who has indicated his/her understanding and acceptance.     Dental advisory given  Plan Discussed with: CRNA  Anesthesia Plan Comments:         Anesthesia Quick Evaluation

## 2021-06-26 NOTE — Anesthesia Procedure Notes (Signed)
Procedure Name: Intubation Date/Time: 06/26/2021 7:42 AM Performed by: Jimmy Picket, CRNA Pre-anesthesia Checklist: Patient identified, Emergency Drugs available, Suction available, Patient being monitored and Timeout performed Patient Re-evaluated:Patient Re-evaluated prior to induction Oxygen Delivery Method: Circle system utilized Preoxygenation: Pre-oxygenation with 100% oxygen Induction Type: IV induction Ventilation: Mask ventilation without difficulty Laryngoscope Size: Miller and 3 Grade View: Grade I Tube type: Oral Rae Tube size: 8.0 mm Number of attempts: 1 Placement Confirmation: ETT inserted through vocal cords under direct vision, positive ETCO2 and breath sounds checked- equal and bilateral Tube secured with: Tape Dental Injury: Teeth and Oropharynx as per pre-operative assessment

## 2021-06-26 NOTE — H&P (Signed)
..  History and Physical paper copy reviewed and updated date of procedure and will be scanned into system.  Patient seen and examined.  

## 2021-06-27 ENCOUNTER — Encounter: Payer: Self-pay | Admitting: Otolaryngology

## 2021-06-28 DIAGNOSIS — G4733 Obstructive sleep apnea (adult) (pediatric): Secondary | ICD-10-CM | POA: Diagnosis not present

## 2021-06-28 LAB — SURGICAL PATHOLOGY

## 2021-07-29 DIAGNOSIS — M5413 Radiculopathy, cervicothoracic region: Secondary | ICD-10-CM | POA: Diagnosis not present

## 2021-07-29 DIAGNOSIS — G4733 Obstructive sleep apnea (adult) (pediatric): Secondary | ICD-10-CM | POA: Diagnosis not present

## 2021-07-29 DIAGNOSIS — M9901 Segmental and somatic dysfunction of cervical region: Secondary | ICD-10-CM | POA: Diagnosis not present

## 2021-07-29 DIAGNOSIS — M542 Cervicalgia: Secondary | ICD-10-CM | POA: Diagnosis not present

## 2021-08-19 DIAGNOSIS — J320 Chronic maxillary sinusitis: Secondary | ICD-10-CM | POA: Diagnosis not present

## 2021-09-30 DIAGNOSIS — G4733 Obstructive sleep apnea (adult) (pediatric): Secondary | ICD-10-CM | POA: Diagnosis not present

## 2021-10-15 ENCOUNTER — Encounter: Payer: Self-pay | Admitting: Emergency Medicine

## 2021-10-15 ENCOUNTER — Other Ambulatory Visit: Payer: Self-pay

## 2021-10-15 ENCOUNTER — Emergency Department
Admission: EM | Admit: 2021-10-15 | Discharge: 2021-10-15 | Disposition: A | Discharge status: Home / Self Care | Payer: Federal, State, Local not specified - PPO | Attending: Emergency Medicine | Admitting: Emergency Medicine

## 2021-10-15 ENCOUNTER — Emergency Department: Payer: Federal, State, Local not specified - PPO

## 2021-10-15 DIAGNOSIS — R079 Chest pain, unspecified: Secondary | ICD-10-CM

## 2021-10-15 DIAGNOSIS — R0789 Other chest pain: Secondary | ICD-10-CM | POA: Insufficient documentation

## 2021-10-15 LAB — BASIC METABOLIC PANEL
Anion gap: 7 (ref 5–15)
BUN: 11 mg/dL (ref 6–20)
CO2: 28 mmol/L (ref 22–32)
Calcium: 9.3 mg/dL (ref 8.9–10.3)
Chloride: 103 mmol/L (ref 98–111)
Creatinine, Ser: 1.08 mg/dL (ref 0.61–1.24)
GFR, Estimated: >60 mL/min (ref >60)
Glucose, Bld: 103 mg/dL — ABNORMAL HIGH (ref 70–99)
Potassium: 3.9 mmol/L (ref 3.5–5.1)
Sodium: 138 mmol/L (ref 135–145)

## 2021-10-15 LAB — CBC
HCT: 44.2 % (ref 39.0–52.0)
Hemoglobin: 14.9 g/dL (ref 13.0–17.0)
MCH: 28.9 pg (ref 26.0–34.0)
MCHC: 33.7 g/dL (ref 30.0–36.0)
MCV: 85.8 fL (ref 80.0–100.0)
Platelets: 358 10*3/uL (ref 150–400)
RBC: 5.15 MIL/uL (ref 4.22–5.81)
RDW: 12.4 % (ref 11.5–15.5)
WBC: 6.7 10*3/uL (ref 4.0–10.5)
nRBC: 0 % (ref 0.0–0.2)

## 2021-10-15 LAB — TROPONIN I (HIGH SENSITIVITY): Troponin I (High Sensitivity): 3 ng/L (ref <18)

## 2021-10-15 MED ORDER — ALPRAZOLAM 0.5 MG PO TABS
1.0000 mg | ORAL_TABLET | Freq: Once | ORAL | Status: AC
  Administered 2021-10-15: 1 mg via ORAL
  Filled 2021-10-15: qty 2

## 2021-10-15 NOTE — ED Notes (Signed)
Pt to ED for intermittent chest pressure since Saturday. Feels restless, has to keep moving around, has not been sleeping well. This morning he got up (after sleeping well last night) and brought kids to school, then suddenly began feeling lightheaded, anxious, chest pressure, and unable to sit still. Occasional palpitations also.  Pt states has been on lexapro, zoloft, propanolol in the past. Recently switched to wellbutron (2.5 weeks ago). States has suffered from anxiety whole life but only received dx 2 years ago.  Accompanied by wife.

## 2021-10-15 NOTE — Discharge Instructions (Addendum)
-  Follow-up with your primary care provider as discussed -Return to the emergency department at any time if you begin to experience any new or worsening symptoms.

## 2021-10-15 NOTE — ED Provider Notes (Signed)
[facility] Provider Note    Event Date/Time   First MD Initiated Contact with Patient 10/15/21 1256     (approximate)   History   Chief Complaint Chest Pain   HPI Richard Donovan is a 36 y.o. male, history of GAD, presents to the emergency department for evaluation of generalized chest pain.  Patient states that he has been experiencing increased anxiety, chest discomfort, and breathlessness for the past few days.  He states that he has been having panic attacks more frequently and states that he has been unable to break his cycle of anxiety for the past few days.  Describes the chest pain as intermittent generalized tightness over the past 72 hours.  No radiation of symptoms.  Denies fever/chills, shortness of breath, abdominal pain, back pain, flank pain, urinary symptoms, headache, or nausea/vomiting.  History Limitations: No limitations.      Physical Exam  Triage Vital Signs: ED Triage Vitals  Enc Vitals Group     BP 10/15/21 1218 (!) 137/97     Pulse Rate 10/15/21 1218 67     Resp 10/15/21 1218 20     Temp 10/15/21 1218 98.4 F (36.9 C)     Temp src --      SpO2 10/15/21 1218 97 %     Weight 10/15/21 1214 257 lb 0.9 oz (116.6 kg)     Height 10/15/21 1214 6\' 1"  (1.854 m)     Head Circumference --      Peak Flow --      Pain Score 10/15/21 1214 5     Pain Loc --      Pain Edu? --      Excl. in [place]? --     Most recent vital signs: Vitals:   10/15/21 1218 10/15/21 1536  BP: (!) 137/97 128/88  Pulse: 67 73  Resp: 20 16  Temp: 98.4 F (36.9 C)   SpO2: 97% 99%    General: Awake, patient appears anxious and restless. CV: Good peripheral perfusion.  S1 and S2 present.  No murmurs rubs or gallops. Resp: Normal effort.  Lung sounds clear bilaterally Abd: Soft, non-tender. No distention.  Neuro: At baseline. No gross neurological deficits. Other: None  Physical Exam    ED Results / Procedures / Treatments  Labs (all labs ordered are  listed, but only abnormal results are displayed) Labs Reviewed  BASIC METABOLIC PANEL - Abnormal; Notable for the following components:      Result Value   Glucose, Bld 103 (*)    All other components within normal limits  CBC  TROPONIN I (HIGH SENSITIVITY)  TROPONIN I (HIGH SENSITIVITY)     EKG Sinus rhythm, rate of 76, no ST segment elevations or depressions, no AV blocks, no axis deviations   RADIOLOGY  ED Provider Interpretation: I personally reviewed and interpreted this image.  No acute findings.  DG Chest 2 View  Result Date: 10/15/2021 CLINICAL DATA:  Chest pain EXAM: CHEST - 2 VIEW COMPARISON:  None. FINDINGS: Lung volumes are low. That low lung volumes exaggerate the heart size. No edema or effusion is present. No focal airspace disease is present. IMPRESSION: 1. Low lung volumes. 2. No acute cardiopulmonary disease. Electronically Signed   By: San Morelle M.D.   On: 10/15/2021 12:42    PROCEDURES:  Critical Care performed: None  Procedures    MEDICATIONS ORDERED IN ED: Medications  ALPRAZolam (XANAX) tablet 1 mg (1 mg Oral Given 10/15/21 1426)  IMPRESSION / MDM / ASSESSMENT AND PLAN / ED COURSE  I reviewed the triage vital signs and the nursing notes.                              Richard Donovan is a 36 y.o. male, history of GAD, presents to the emergency department for evaluation of generalized chest pain.  Patient states that he has been experiencing increased anxiety, chest discomfort, and breathlessness for the past few days.  He states that he has been having panic attacks more frequently and states that he has been unable to break his cycle of anxiety for the past few days.  Describes the chest pain as intermittent generalized tightness over the past 72 hours.   Differential diagnosis includes, but is not limited to, ACS, PE, costochondritis, anxiety/depression, pneumonia, pericarditis/myocarditis   ED Course Patient appears stable, vital signs  within normal limits, though patient does appear very restless and anxious.  We will go ahead and treat with alprazolam 1 mg.  CBC shows no leukocytosis or anemia.  BMP unremarkable for electrolyte abnormalities or kidney injury.  Initial troponin 3.  EKG unremarkable.  Unlikely ACS.  Chest x-ray shows no acute cardiopulmonary process.  Assessment/Plan Given the patient's history, physical exam, and work-up thus far, I do not suspect any serious or life-threatening pathology.  He is PERC negative.  Unlikely ACS or myocarditis given unremarkable troponin and EKG.  I suspect that the patient's symptoms are likely related to anxiety, exacerbated by recent change in his antianxiety medication.  Patient responded well to alprazolam here.  We will plan to discharge this patient with primary care provider follow-up  Patient was provided with anticipatory guidance, return precautions, and educational material. Encouraged the patient to return to the emergency department at any time if they begin to experience any new or worsening symptoms.       FINAL CLINICAL IMPRESSION(S) / ED DIAGNOSES   Final diagnoses:  Chest pain, unspecified type     Rx / DC Orders   ED Discharge Orders     None        Note:  This document was prepared using Dragon voice recognition software and may include unintentional dictation errors.   Teodoro Spray, [place] 10/15/21 Drema Halon    Harvest Dark, MD 10/18/21 2059

## 2021-10-15 NOTE — ED Triage Notes (Signed)
Pt comes into the ED via POV c/o generalized chest pain that started Saturday.  Pt states it is a pressure throughout the chest.  Pt denies any cardiac history.  Pt states he just got started on wellbutrin for anxiety.

## 2021-10-24 DIAGNOSIS — M7520 Bicipital tendinitis, unspecified shoulder: Secondary | ICD-10-CM | POA: Insufficient documentation

## 2021-10-31 DIAGNOSIS — G4733 Obstructive sleep apnea (adult) (pediatric): Secondary | ICD-10-CM | POA: Diagnosis not present

## 2021-11-04 ENCOUNTER — Encounter: Payer: Self-pay | Admitting: Family Medicine

## 2021-11-04 ENCOUNTER — Other Ambulatory Visit: Payer: Self-pay

## 2021-11-04 ENCOUNTER — Ambulatory Visit: Payer: Federal, State, Local not specified - PPO | Admitting: Family Medicine

## 2021-11-04 VITALS — BP 118/84 | HR 63 | Ht 73.0 in | Wt 256.0 lb

## 2021-11-04 DIAGNOSIS — M25511 Pain in right shoulder: Secondary | ICD-10-CM | POA: Diagnosis not present

## 2021-11-04 DIAGNOSIS — J3089 Other allergic rhinitis: Secondary | ICD-10-CM | POA: Diagnosis not present

## 2021-11-04 DIAGNOSIS — F411 Generalized anxiety disorder: Secondary | ICD-10-CM | POA: Diagnosis not present

## 2021-11-04 DIAGNOSIS — G8929 Other chronic pain: Secondary | ICD-10-CM | POA: Insufficient documentation

## 2021-11-04 DIAGNOSIS — G4730 Sleep apnea, unspecified: Secondary | ICD-10-CM

## 2021-11-04 DIAGNOSIS — M25512 Pain in left shoulder: Secondary | ICD-10-CM

## 2021-11-04 MED ORDER — PROPRANOLOL HCL 10 MG PO TABS: 20.0000 mg | ORAL_TABLET | Freq: Every day | ORAL | 2 refills | Status: DC

## 2021-11-04 NOTE — Assessment & Plan Note (Signed)
>>  ASSESSMENT AND PLAN FOR ANXIETY AND DEPRESSION WRITTEN ON 11/04/2021  4:43 PM BY Keysi Oelkers, Ocie Bob, MD  Chronic condition with past usage of various SSRIs, could not tolerate adverse effect profile, was transition to bupropion with severely worsening anxiety.  Has since been addressing his symptoms with propranolol 20 mg daily and additional 10 mg propranolol in the p.m. on an as-needed basis, additionally counseling and exercise.  He is doing reasonably well with this approach and we have discussed both additional pharmacologic and nonpharmacologic methods should the need arise.

## 2021-11-04 NOTE — Patient Instructions (Signed)
-   Current medications - Consider incorporating Flonase, 2 sprays in each nostril daily until follow-up - Can continue OTC allergy medication (Allegra, Zyrtec, etc.) - Start home exercises with information provided - Return for follow-up in 4 weeks for annual physical

## 2021-11-04 NOTE — Assessment & Plan Note (Signed)
Chronic condition, this is in the setting of multiple sinus surgeries, and previous allergy shot therapy which resulted in worsening symptoms.  Currently he is dosing OTC antihistamines with mixed benefit.  I have advised a transition to scheduled Flonase with secondary OTC antihistamine, we will reassess this in 1 month at his annual physical.  Can escalate Rx intranasal steroids.

## 2021-11-04 NOTE — Assessment & Plan Note (Signed)
Chronic condition with past usage of various SSRIs, could not tolerate adverse effect profile, was transition to bupropion with severely worsening anxiety.  Has since been addressing his symptoms with propranolol 20 mg daily and additional 10 mg propranolol in the p.m. on an as-needed basis, additionally counseling and exercise.  He is doing reasonably well with this approach and we have discussed both additional pharmacologic and nonpharmacologic methods should the need arise.

## 2021-11-04 NOTE — Progress Notes (Signed)
°  ° °  Primary Care / Sports Medicine Office Visit  Patient Information:  Patient ID: Richard Donovan, male DOB: Mar 25, 1986 Age: 36 y.o. MRN: [id number]   Richard Donovan is a pleasant 36 y.o. male presenting with the following:  Chief Complaint  Patient presents with   New Patient (Initial Visit)   Establish Care    Vitals:   11/04/21 1500  BP: 118/84  Pulse: 63  SpO2: 97%   Vitals:   11/04/21 1500  Weight: 256 lb (116.1 kg)  Height: 6\' 1"  (1.854 m)   Body mass index is 33.78 kg/m.  DG Chest 2 View  Result Date: 10/15/2021 CLINICAL DATA:  Chest pain EXAM: CHEST - 2 VIEW COMPARISON:  None. FINDINGS: Lung volumes are low. That low lung volumes exaggerate the heart size. No edema or effusion is present. No focal airspace disease is present. IMPRESSION: 1. Low lung volumes. 2. No acute cardiopulmonary disease. Electronically Signed   By: San Morelle M.D.   On: 10/15/2021 12:42     Independent interpretation of notes and tests performed by another provider:   None  Procedures performed:   None  Pertinent History, Exam, Impression, and Recommendations:   Sleep apnea Chronic condition, utilizes CPAP  Environmental and seasonal allergies Chronic condition, this is in the setting of multiple sinus surgeries, and previous allergy shot therapy which resulted in worsening symptoms.  Currently he is dosing OTC antihistamines with mixed benefit.  I have advised a transition to scheduled Flonase with secondary OTC antihistamine, we will reassess this in 1 month at his annual physical.  Can escalate Rx intranasal steroids.  Chronic pain of both shoulders Longstanding issue, atraumatic in onset, has been addressed in the past with recommendation for physical therapy, currently he is performing home exercises for this with mixed benefit.  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  I provided a home-based rehab  program to focus on stabilization, we can follow-up on this issue at his return.  Anxiety, generalized Chronic condition with past usage of various SSRIs, could not tolerate adverse effect profile, was transition to bupropion with severely worsening anxiety.  Has since been addressing his symptoms with propranolol 20 mg daily and additional 10 mg propranolol in the p.m. on an as-needed basis, additionally counseling and exercise.  He is doing reasonably well with this approach and we have discussed both additional pharmacologic and nonpharmacologic methods should the need arise.   I provided a total time of 48 minutes including both face-to-face and non-face-to-face time on 11/04/2021 inclusive of time utilized for medical chart review, information gathering, care coordination with staff, and documentation completion.  Specifically reviewed his medical history, treatments to date in regards to anxiety, etiological components, pharmacologic and nonpharmacologic treatment strategies.  Additionally, we discussed chronic bilateral shoulder pain, prior work-up, next initial steps.  Lastly, reviewed treatments for his longstanding seasonal allergies.   Orders & Medications Meds ordered this encounter  Medications   propranolol (INDERAL) 10 MG tablet    Sig: Take 2 tablets (20 mg total) by mouth daily.    Dispense:  60 tablet    Refill:  2   No orders of the defined types were placed in this encounter.    Return in about 4 weeks (around 12/02/2021) for Annual physical.     Montel Culver, MD   [place]

## 2021-11-04 NOTE — Assessment & Plan Note (Signed)
Chronic condition, utilizes CPAP

## 2021-11-04 NOTE — Assessment & Plan Note (Signed)
Longstanding issue, atraumatic in onset, has been addressed in the past with recommendation for physical therapy, currently he is performing home exercises for this with mixed benefit.  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  I provided a home-based rehab program to focus on stabilization, we can follow-up on this issue at his return.

## 2021-11-28 DIAGNOSIS — G4733 Obstructive sleep apnea (adult) (pediatric): Secondary | ICD-10-CM | POA: Diagnosis not present

## 2021-12-02 ENCOUNTER — Encounter: Payer: Federal, State, Local not specified - PPO | Admitting: Family Medicine

## 2021-12-05 ENCOUNTER — Encounter: Payer: Federal, State, Local not specified - PPO | Admitting: Family Medicine

## 2021-12-10 ENCOUNTER — Ambulatory Visit (INDEPENDENT_AMBULATORY_CARE_PROVIDER_SITE_OTHER): Payer: Federal, State, Local not specified - PPO | Admitting: Family Medicine

## 2021-12-10 ENCOUNTER — Encounter: Payer: Self-pay | Admitting: Family Medicine

## 2021-12-10 VITALS — BP 130/64 | HR 68 | Ht 73.0 in | Wt 255.6 lb

## 2021-12-10 DIAGNOSIS — Z114 Encounter for screening for human immunodeficiency virus [HIV]: Secondary | ICD-10-CM | POA: Diagnosis not present

## 2021-12-10 DIAGNOSIS — Z0000 Encounter for general adult medical examination without abnormal findings: Secondary | ICD-10-CM | POA: Diagnosis not present

## 2021-12-10 DIAGNOSIS — G8929 Other chronic pain: Secondary | ICD-10-CM

## 2021-12-10 DIAGNOSIS — J069 Acute upper respiratory infection, unspecified: Secondary | ICD-10-CM | POA: Diagnosis not present

## 2021-12-10 DIAGNOSIS — M24812 Other specific joint derangements of left shoulder, not elsewhere classified: Secondary | ICD-10-CM

## 2021-12-10 DIAGNOSIS — J3089 Other allergic rhinitis: Secondary | ICD-10-CM

## 2021-12-10 DIAGNOSIS — F411 Generalized anxiety disorder: Secondary | ICD-10-CM

## 2021-12-10 DIAGNOSIS — M25512 Pain in left shoulder: Secondary | ICD-10-CM

## 2021-12-10 DIAGNOSIS — Z1159 Encounter for screening for other viral diseases: Secondary | ICD-10-CM | POA: Diagnosis not present

## 2021-12-10 DIAGNOSIS — Z13220 Encounter for screening for lipoid disorders: Secondary | ICD-10-CM

## 2021-12-10 DIAGNOSIS — M24811 Other specific joint derangements of right shoulder, not elsewhere classified: Secondary | ICD-10-CM

## 2021-12-10 DIAGNOSIS — M25511 Pain in right shoulder: Secondary | ICD-10-CM

## 2021-12-10 DIAGNOSIS — R7989 Other specified abnormal findings of blood chemistry: Secondary | ICD-10-CM

## 2021-12-10 LAB — POCT RAPID STREP A (OFFICE): Rapid Strep A Screen: NEGATIVE

## 2021-12-10 NOTE — Progress Notes (Signed)
?  ? ?Annual Physical Exam Visit ? ?Patient Information:  ?Patient ID: Richard Donovan, male DOB: Mar 29, 1986 Age: 36 y.o. MRN: [id number]  ? ?Subjective:  ? ?CC: Annual Physical Exam ? ?HPI:  ?Richard Donovan is here for their annual physical. ? ?I reviewed the past medical history, family history, social history, surgical history, and allergies today and changes were made as necessary.  Please see the problem list section below for additional details. ? ?Past Medical History: ?Past Medical History:  ?Diagnosis Date  ? Allergies   ? Allergy   ? Family history of adverse reaction to anesthesia   ? Mother - slow to wake  ? Sleep apnea   ? CPAP  ? ?Past Surgical History: ?Past Surgical History:  ?Procedure Laterality Date  ? ADENOIDECTOMY    ? EAR TUBE REMOVAL    ? ETHMOIDECTOMY Left 06/26/2021  ? Procedure: ANTERIOR ETHMOIDECTOMY;  Surgeon: Bud Face, MD;  Location: [facility];  Service: ENT;  Laterality: Left;  sleep apnea  ? FRONTAL SINUS EXPLORATION Left 06/26/2021  ? Procedure: FRONTAL SINUS EXPLORATION;  Surgeon: Bud Face, MD;  Location: [facility];  Service: ENT;  Laterality: Left;  ? IMAGE GUIDED SINUS SURGERY N/A 06/26/2021  ? Procedure: IMAGE GUIDED SINUS SURGERY;  Surgeon: Bud Face, MD;  Location: [facility];  Service: ENT;  Laterality: N/A;  need stryker disk ?placed disk on OR CHARGE nurse desk 10-11 kp  ? MAXILLARY ANTROSTOMY Bilateral 06/26/2021  ? Procedure: MAXILLARY ANTROSTOMY WITH TISSUE REMOVAL;  Surgeon: Bud Face, MD;  Location: [facility];  Service: ENT;  Laterality: Bilateral;  ? NASAL SEPTOPLASTY W/ TURBINOPLASTY Bilateral 06/26/2021  ? Procedure: NASAL SEPTOPLASTY WITH INFERIOR TURBINATE REDUCTION;  Surgeon: Bud Face, MD;  Location: [facility];  Service: ENT;  Laterality: Bilateral;  ? TYMPANOSTOMY TUBE PLACEMENT Bilateral   ? WISDOM TOOTH EXTRACTION    ? ?Family History: ?Family History  ?Problem Relation Age of  Onset  ? Anxiety disorder Mother   ? Diabetes Mother   ? Obesity Mother   ? Obesity Father   ? Diabetes Father   ? Tremor Father   ? Anxiety disorder Sister   ? Lung disease Maternal Grandmother   ? Diabetes Maternal Grandfather   ? Dementia Paternal Grandmother   ? Heart attack Paternal Grandfather   ? Coronary artery disease Paternal Grandfather   ? ?Allergies: ?Allergies  ?Allergen Reactions  ? Sudafed [Pseudoephedrine] Palpitations  ? ?Health Maintenance: ?Health Maintenance  ?Topic Date Due  ? Hepatitis C Screening  Never done  ? COVID-19 Vaccine (1) 12/26/2021 (Originally 09/07/1986)  ? TETANUS/TDAP  12/11/2022 (Originally 03/07/2006)  ? INFLUENZA VACCINE  04/08/2022  ? HIV Screening  Completed  ? HPV VACCINES  Aged Out  ?  ?HM Colonoscopy   ? ? This patient has no relevant Health Maintenance data.  ? ?  ? ?Medications: ?Current Outpatient Medications on File Prior to Visit  ?Medication Sig Dispense Refill  ? fexofenadine (ALLEGRA) 180 MG tablet Take 180 mg by mouth daily.    ? propranolol (INDERAL) 10 MG tablet Take 2 tablets (20 mg total) by mouth daily. 60 tablet 2  ? ?No current facility-administered medications on file prior to visit.  ? ? ?Review of Systems: No headache, visual changes, nausea, vomiting, diarrhea, constipation, dizziness, abdominal pain, skin rash, fevers, chills, night sweats, swollen lymph nodes, weight loss, chest pain, body aches, joint swelling, muscle aches, shortness of breath, mood changes, visual or auditory hallucinations reported. ? ?Objective:  ? ?  Vitals:  ? 12/10/21 1331  ?BP: 130/64  ?Pulse: 68  ?SpO2: 99%  ? ?Vitals:  ? 12/10/21 1331  ?Weight: 255 lb 9.6 oz (115.9 kg)  ?Height: 6\' 1"  (1.854 m)  ? ?Body mass index is 33.72 kg/m?. ? ?General: Well Developed, well nourished, and in no acute distress.  ?Neuro: Alert and oriented x3, extra-ocular muscles intact, sensation grossly intact. Cranial nerves II through XII are grossly intact, motor, sensory, and coordinative functions  are intact. ?HEENT: Normocephalic, atraumatic, pupils equal round reactive to light, neck supple, no masses, no lymphadenopathy, thyroid nonpalpable. Oropharynx mildly erythematous, nasopharynx, external ear canals are unremarkable. ?Skin: Warm and dry, no rashes noted.  ?Cardiac: Regular rate and rhythm, no murmurs rubs or gallops. No peripheral edema. Pulses symmetric. ?Respiratory: Clear to auscultation bilaterally. Not using accessory muscles, speaking in full sentences.  ?Abdominal: Soft, nontender, nondistended, positive bowel sounds, no masses, no organomegaly. ?Musculoskeletal: Shoulder, elbow, wrist, hip, knee, ankle stable, and with full range of motion. ? ?Impression and Recommendations:  ? ?The patient was counselled, risk factors were discussed, and anticipatory guidance given. ? ?Chronic pain of both shoulders ?Chronic pain in the setting of previous MRI confirmed labral injuries bilaterally, these were obtained years prior, home exercises performed with limited response.  We reviewed both surgical and nonsurgical treatments, he is amenable to surgical evaluation.  MR arthrogram of bilateral shoulders ordered today, await results prior to anticipated referral to orthopedic surgery. ? ?Anxiety, generalized ?Very well controlled on daily propranolol ? ?Environmental and seasonal allergies ?Typically controlled with oral antihistamine, has incorporated Flonase which was providing benefit up until recently, patient and multiple family members developed throat pain, no cough, strep test ordered in clinic, negative.  Supportive care advised, can contact if symptoms progressively worsen. ? ?Annual physical exam ?Annual examination completed, risk stratification labs ordered, anticipatory guidance provided.  We will follow labs once resulted. ? ?Orders & Medications ?Medications: No orders of the defined types were placed in this encounter. ? ?Orders Placed This Encounter  ?Procedures  ? DG Arthro Shoulder  Right  ? DG Arthro Shoulder Left  ? Apo A1 + B + Ratio  ? Comprehensive metabolic panel  ? Hepatitis C antibody  ? HIV Antibody (routine testing w rflx)  ? Lipid panel  ? TSH  ? VITAMIN D 25 Hydroxy (Vit-D Deficiency, Fractures)  ? CBC  ? POCT rapid strep A  ?  ? ?Return in about 1 year (around 12/11/2022).  ? ? ?02/10/2023, MD ? ? Primary Care Sports Medicine ?[facility] ?[facility]  ? ?

## 2021-12-10 NOTE — Assessment & Plan Note (Signed)
Very well controlled on daily propranolol ?

## 2021-12-10 NOTE — Assessment & Plan Note (Signed)
Typically controlled with oral antihistamine, has incorporated Flonase which was providing benefit up until recently, patient and multiple family members developed throat pain, no cough, strep test ordered in clinic, negative.  Supportive care advised, can contact us if symptoms progressively worsen. ?

## 2021-12-10 NOTE — Assessment & Plan Note (Signed)
>>  ASSESSMENT AND PLAN FOR ANXIETY AND DEPRESSION WRITTEN ON 12/10/2021  4:57 PM BY Steffi Noviello, Ocie Bob, MD  Very well controlled on daily propranolol

## 2021-12-10 NOTE — Assessment & Plan Note (Signed)
Chronic pain in the setting of previous MRI confirmed labral injuries bilaterally, these were obtained years prior, home exercises performed with limited response.  We reviewed both surgical and nonsurgical treatments, he is amenable to surgical evaluation.  MR arthrogram of bilateral shoulders ordered today, await results prior to anticipated referral to orthopedic surgery. ?

## 2021-12-10 NOTE — Patient Instructions (Addendum)
-   Obtain fasting labs with orders provided (can have water or black coffee but otherwise no food or drink x 8 hours before labs) ?- Referral coordinator will contact you in regards to MR arthrogram scheduling ?- Review information provided ?- Attend eye doctor annually, dentist every 6 months, work towards or maintain 30 minutes of moderate intensity physical activity at least 5 days per week, and consume a balanced diet ?- Return in 1 year for physical ?- Contact us for any questions between now and then ?

## 2021-12-10 NOTE — Assessment & Plan Note (Signed)
Annual examination completed, risk stratification labs ordered, anticipatory guidance provided.  We will follow labs once resulted. 

## 2022-01-01 DIAGNOSIS — G4733 Obstructive sleep apnea (adult) (pediatric): Secondary | ICD-10-CM | POA: Diagnosis not present

## 2022-01-31 DIAGNOSIS — G4733 Obstructive sleep apnea (adult) (pediatric): Secondary | ICD-10-CM | POA: Diagnosis not present

## 2022-03-03 DIAGNOSIS — G4733 Obstructive sleep apnea (adult) (pediatric): Secondary | ICD-10-CM | POA: Diagnosis not present

## 2022-03-05 DIAGNOSIS — Z3009 Encounter for other general counseling and advice on contraception: Secondary | ICD-10-CM | POA: Diagnosis not present

## 2022-03-07 ENCOUNTER — Other Ambulatory Visit: Payer: Self-pay | Admitting: Family Medicine

## 2022-03-07 ENCOUNTER — Other Ambulatory Visit: Payer: Self-pay

## 2022-03-07 DIAGNOSIS — F411 Generalized anxiety disorder: Secondary | ICD-10-CM

## 2022-03-07 MED ORDER — PROPRANOLOL HCL 10 MG PO TABS: 20.0000 mg | ORAL_TABLET | Freq: Every day | ORAL | 0 refills | Status: DC

## 2022-03-07 NOTE — Telephone Encounter (Signed)
Duplicate request. Requested Prescriptions  Pending Prescriptions Disp Refills  . propranolol (INDERAL) 10 MG tablet [Pharmacy Med Name: PROPRANOLOL 10 MG TABLET] 180 tablet     Sig: TAKE 2 TABLETS BY MOUTH EVERY DAY     Cardiovascular:  Beta Blockers Passed - 03/07/2022  8:08 AM      Passed - Last BP in normal range    BP Readings from Last 1 Encounters:  12/10/21 130/64         Passed - Last Heart Rate in normal range    Pulse Readings from Last 1 Encounters:  12/10/21 68         Passed - Valid encounter within last 6 months    Recent Outpatient Visits          2 months ago Annual physical exam   [facility] [name], MD   4 months ago Chronic pain of both shoulders   [facility] [name], MD      Future Appointments            In 9 months Ashley Royalty, Ocie Bob, MD [facility], [facility]

## 2022-03-19 HISTORY — PX: VASECTOMY: SHX75

## 2022-03-27 ENCOUNTER — Telehealth: Payer: Federal, State, Local not specified - PPO | Admitting: Family Medicine

## 2022-03-27 ENCOUNTER — Encounter: Payer: Self-pay | Admitting: Family Medicine

## 2022-03-27 ENCOUNTER — Telehealth: Payer: Self-pay | Admitting: Family Medicine

## 2022-03-27 DIAGNOSIS — F419 Anxiety disorder, unspecified: Secondary | ICD-10-CM | POA: Diagnosis not present

## 2022-03-27 DIAGNOSIS — F32A Depression, unspecified: Secondary | ICD-10-CM

## 2022-03-27 MED ORDER — HYDROXYZINE PAMOATE 25 MG PO CAPS: 25.0000 mg | ORAL_CAPSULE | Freq: Three times a day (TID) | ORAL | 0 refills | Status: DC | PRN

## 2022-03-27 MED ORDER — VORTIOXETINE HBR 10 MG PO TABS: 10.0000 mg | ORAL_TABLET | Freq: Every day | ORAL | 0 refills | Status: DC

## 2022-03-27 NOTE — Progress Notes (Signed)
Primary Care / Sports Medicine Virtual Visit  Patient Information:  Patient ID: Richard Donovan, male DOB: 1985-12-24 Age: 36 y.o. MRN: [id number]   Richard Donovan is a pleasant 36 y.o. male presenting with the following:  Chief Complaint  Patient presents with   Anxiety    Review of Systems: No fevers, chills, night sweats, weight loss, chest pain, or shortness of breath.   Patient Active Problem List   Diagnosis Date Noted   Annual physical exam 12/10/2021   Chronic pain of both shoulders 11/04/2021   Sleep apnea    Biceps tendinitis 10/24/2021   Anxiety and depression 10/04/2013   Environmental and seasonal allergies 10/04/1998   Past Medical History:  Diagnosis Date   Allergies    Allergy    Anxiety    Family history of adverse reaction to anesthesia    Mother - slow to wake   Sleep apnea    CPAP   Outpatient Encounter Medications as of 03/27/2022  Medication Sig   hydrOXYzine (VISTARIL) 25 MG capsule Take 1-2 capsules (25-50 mg total) by mouth every 8 (eight) hours as needed for anxiety.   propranolol (INDERAL) 10 MG tablet Take 2 tablets (20 mg total) by mouth daily.   vortioxetine HBr (TRINTELLIX) 10 MG TABS tablet Take 1 tablet (10 mg total) by mouth daily.   [DISCONTINUED] fexofenadine (ALLEGRA) 180 MG tablet Take 180 mg by mouth daily.   No facility-administered encounter medications on file as of 03/27/2022.   Past Surgical History:  Procedure Laterality Date   ADENOIDECTOMY     EAR TUBE REMOVAL     ETHMOIDECTOMY Left 06/26/2021   Procedure: ANTERIOR ETHMOIDECTOMY;  Surgeon: Bud Face, MD;  Location: [facility];  Service: ENT;  Laterality: Left;  sleep apnea   FRONTAL SINUS EXPLORATION Left 06/26/2021   Procedure: FRONTAL SINUS EXPLORATION;  Surgeon: Bud Face, MD;  Location: [facility];  Service: ENT;  Laterality: Left;   IMAGE GUIDED SINUS SURGERY N/A 06/26/2021   Procedure: IMAGE GUIDED SINUS SURGERY;  Surgeon: Bud Face, MD;  Location: [facility];  Service: ENT;  Laterality: N/A;  need stryker disk placed disk on OR CHARGE nurse desk 10-11 kp   MAXILLARY ANTROSTOMY Bilateral 06/26/2021   Procedure: MAXILLARY ANTROSTOMY WITH TISSUE REMOVAL;  Surgeon: Bud Face, MD;  Location: [facility];  Service: ENT;  Laterality: Bilateral;   NASAL SEPTOPLASTY W/ TURBINOPLASTY Bilateral 06/26/2021   Procedure: NASAL SEPTOPLASTY WITH INFERIOR TURBINATE REDUCTION;  Surgeon: Bud Face, MD;  Location: [facility];  Service: ENT;  Laterality: Bilateral;   TYMPANOSTOMY TUBE PLACEMENT Bilateral    VASECTOMY  03/19/2022   WISDOM TOOTH EXTRACTION      Virtual Visit via MyChart Video:   I connected with Richard Donovan on 03/27/22 via MyChart Video and verified that I am speaking with the correct person using appropriate identifiers.   The limitations, risks, security and privacy concerns of performing an evaluation and management service by MyChart Video, including the higher likelihood of inaccurate diagnoses and treatments, and the availability of in person appointments were reviewed. The possible need of an additional face-to-face encounter for complete and high quality delivery of care was discussed. The patient was also made aware that there may be a patient responsible charge related to this service. The patient expressed understanding and wishes to proceed.  Provider location is in medical facility. Patient location is at their home, different from provider location. People involved in care of the patient during  this telehealth encounter were myself, my nurse/medical assistant, and my front office/scheduling team member.  Objective findings:   General: Speaking full sentences, no audible heavy breathing. Sounds alert and appropriately interactive. Well-appearing. Face symmetric. Extraocular movements intact. Pupils equal and round. No nasal flaring or accessory muscle use  visualized.  Independent interpretation of notes and tests performed by another provider:   None  Pertinent History, Exam, Impression, and Recommendations:   Anxiety and depression Patient presents for follow-up to anxiety/depression, chronic issue for which she had previously been on Lexapro during the 2020 09-2020 timeframe, unfortunately he noted sexual adverse effects (decreased libido, anorgasmia) and discontinue this.  He has noted some response from propranolol but has noted worsening symptoms with panic attacks as of late.  We discussed additional treatment strategies and he is amenable to initiation of Trintellix, plan for 10 mg daily dosing, as needed hydroxyzine, and he will continue the propranolol until symptoms are fully controlled.  Virtual MyChart follow-up visit in 6 weeks.  Orders & Medications Meds ordered this encounter  Medications   vortioxetine HBr (TRINTELLIX) 10 MG TABS tablet    Sig: Take 1 tablet (10 mg total) by mouth daily.    Dispense:  90 tablet    Refill:  0    Use discount coupon   hydrOXYzine (VISTARIL) 25 MG capsule    Sig: Take 1-2 capsules (25-50 mg total) by mouth every 8 (eight) hours as needed for anxiety.    Dispense:  30 capsule    Refill:  0   No orders of the defined types were placed in this encounter.    I discussed the above assessment and treatment plan with the patient. The patient was provided an opportunity to ask questions and all were answered. The patient agreed with the plan and demonstrated an understanding of the instructions.   The patient was advised to call back or seek an in-person evaluation if the symptoms worsen or if the condition fails to improve as anticipated.   I provided a total time of 30 minutes including both face-to-face and non-face-to-face time on 03/27/2022 inclusive of time utilized for medical chart review, information gathering, care coordination with staff, and documentation completion.    Richard Banana, MD   Primary Care Sports Medicine [facility]

## 2022-03-27 NOTE — Assessment & Plan Note (Signed)
Patient presents for follow-up to anxiety/depression, chronic issue for which she had previously been on Lexapro during the 2020 09-2020 timeframe, unfortunately he noted sexual adverse effects (decreased libido, anorgasmia) and discontinue this.  He has noted some response from propranolol but has noted worsening symptoms with panic attacks as of late.  We discussed additional treatment strategies and he is amenable to initiation of Trintellix, plan for 10 mg daily dosing, as needed hydroxyzine, and he will continue the propranolol until symptoms are fully controlled.  Virtual MyChart follow-up visit in 6 weeks.

## 2022-03-27 NOTE — Assessment & Plan Note (Signed)
>>  ASSESSMENT AND PLAN FOR ANXIETY AND DEPRESSION WRITTEN ON 03/27/2022  1:51 PM BY Tamantha Saline, Ocie Bob, MD  Patient presents for follow-up to anxiety/depression, chronic issue for which she had previously been on Lexapro during the 2020 09-2020 timeframe, unfortunately he noted sexual adverse effects (decreased libido, anorgasmia) and discontinue this.  He has noted some response from propranolol but has noted worsening symptoms with panic attacks as of late.  We discussed additional treatment strategies and he is amenable to initiation of Trintellix, plan for 10 mg daily dosing, as needed hydroxyzine, and he will continue the propranolol until symptoms are fully controlled.  Virtual MyChart follow-up visit in 6 weeks.

## 2022-03-27 NOTE — Telephone Encounter (Signed)
Copied from CRM [id number]. Topic: General - Inquiry >> Mar 27, 2022  3:05 PM Runell Gess P wrote: Reason for CRM: Pt called saying he heard from the pharmacy regarding the cost of the Trintellix and it would be 700..00 for the month.  He wanted to [facility] Dr. Ashley Royalty know and wants to know what he should do  CB#   [phone]

## 2022-03-27 NOTE — Telephone Encounter (Signed)
Please advise 

## 2022-03-27 NOTE — Patient Instructions (Addendum)
-   Start Trintellix 10 mg daily - Can use hydroxyzine, 1-2 capsules up to 3 times a day for anxiety attacks - Continue propranolol, review patient information - Virtual MyChart video follow-up in 6 weeks -  [email or web address]

## 2022-03-27 NOTE — Telephone Encounter (Signed)
Please review.  KP

## 2022-03-28 ENCOUNTER — Other Ambulatory Visit: Payer: Self-pay | Admitting: Family Medicine

## 2022-03-28 DIAGNOSIS — F32A Depression, unspecified: Secondary | ICD-10-CM

## 2022-03-28 MED ORDER — SERTRALINE HCL 50 MG PO TABS: 50.0000 mg | ORAL_TABLET | Freq: Every day | ORAL | 3 refills | Status: DC

## 2022-03-31 NOTE — Telephone Encounter (Signed)
Appointment made for 05/07/2022 virtual.

## 2022-04-01 ENCOUNTER — Other Ambulatory Visit: Payer: Self-pay | Admitting: Family Medicine

## 2022-04-01 DIAGNOSIS — G4733 Obstructive sleep apnea (adult) (pediatric): Secondary | ICD-10-CM | POA: Diagnosis not present

## 2022-04-01 DIAGNOSIS — F411 Generalized anxiety disorder: Secondary | ICD-10-CM

## 2022-04-02 DIAGNOSIS — G4733 Obstructive sleep apnea (adult) (pediatric): Secondary | ICD-10-CM | POA: Diagnosis not present

## 2022-04-02 NOTE — Telephone Encounter (Signed)
Requested Prescriptions  Pending Prescriptions Disp Refills  . propranolol (INDERAL) 10 MG tablet [Pharmacy Med Name: PROPRANOLOL 10 MG TABLET] 180 tablet 0    Sig: TAKE 2 TABLETS BY MOUTH EVERY DAY     Cardiovascular:  Beta Blockers Passed - 04/01/2022 12:34 PM      Passed - Last BP in normal range    BP Readings from Last 1 Encounters:  12/10/21 130/64         Passed - Last Heart Rate in normal range    Pulse Readings from Last 1 Encounters:  12/10/21 68         Passed - Valid encounter within last 6 months    Recent Outpatient Visits          6 days ago Anxiety and depression   [facility] [name], MD   3 months ago Annual physical exam   [facility] [name], MD   4 months ago Chronic pain of both shoulders   [facility] [name], MD      Future Appointments            In 1 month Ashley Royalty, Ocie Bob, MD [facility], [facility]   In 8 months Ashley Royalty, Ocie Bob, MD [facility], [facility]

## 2022-04-19 ENCOUNTER — Other Ambulatory Visit: Payer: Self-pay | Admitting: Family Medicine

## 2022-04-19 DIAGNOSIS — F419 Anxiety disorder, unspecified: Secondary | ICD-10-CM

## 2022-04-21 NOTE — Telephone Encounter (Signed)
Requested medication (s) are due for refill today: no  Requested medication (s) are on the active medication list: yes  Last refill:  03/28/22 #30 3 tablets  Future visit scheduled: yes  Notes to clinic:  pharmacy requesting 90 day refills   Requested Prescriptions  Pending Prescriptions Disp Refills   sertraline (ZOLOFT) 50 MG tablet [Pharmacy Med Name: SERTRALINE HCL 50 MG TABLET] 90 tablet 2    Sig: TAKE 1 TABLET BY MOUTH EVERY DAY     Psychiatry:  Antidepressants - SSRI - sertraline Failed - 04/19/2022 10:32 AM      Failed - AST in normal range and within 360 days    No results found for: "POCAST", "AST"       Failed - ALT in normal range and within 360 days    No results found for: "ALT", "LABALT", "POCALT"       Passed - Completed PHQ-2 or PHQ-9 in the last 360 days      Passed - Valid encounter within last 6 months    Recent Outpatient Visits           3 weeks ago Anxiety and depression   [facility] [name], MD   4 months ago Annual physical exam   [facility] [name], MD   5 months ago Chronic pain of both shoulders   [facility] [name], MD       Future Appointments             In 2 weeks Ashley Royalty, Ocie Bob, MD [facility], [facility]   In 7 months Ashley Royalty, Ocie Bob, MD [facility], [facility]

## 2022-05-02 DIAGNOSIS — G4733 Obstructive sleep apnea (adult) (pediatric): Secondary | ICD-10-CM | POA: Diagnosis not present

## 2022-05-07 ENCOUNTER — Telehealth (INDEPENDENT_AMBULATORY_CARE_PROVIDER_SITE_OTHER): Payer: Federal, State, Local not specified - PPO | Admitting: Family Medicine

## 2022-05-07 ENCOUNTER — Encounter: Payer: Self-pay | Admitting: Family Medicine

## 2022-05-07 DIAGNOSIS — F419 Anxiety disorder, unspecified: Secondary | ICD-10-CM | POA: Diagnosis not present

## 2022-05-07 DIAGNOSIS — F411 Generalized anxiety disorder: Secondary | ICD-10-CM | POA: Diagnosis not present

## 2022-05-07 DIAGNOSIS — F32A Depression, unspecified: Secondary | ICD-10-CM

## 2022-05-07 MED ORDER — BUSPIRONE HCL 5 MG PO TABS: ORAL_TABLET | ORAL | 0 refills | Status: DC

## 2022-05-07 MED ORDER — PROPRANOLOL HCL 10 MG PO TABS: 20.0000 mg | ORAL_TABLET | Freq: Every day | ORAL | 0 refills | Status: DC

## 2022-05-07 NOTE — Assessment & Plan Note (Addendum)
Did end up going down to 25 mg Zoloft x roughly 2 weeks then has been up to 50 mg since, tolerating well. Sleeping well, decreased appetite leading to wanted weight loss, no energy issues. No libido issues, but some degree anorgasmia.  Discussed various methods to address this issue, consideration of PDE 5 inhibitors, other medications.  He is amenable to further addition buspirone 5 mg for augmentation, understands limited data for addressing the issues he brings up however should further help mood component.  He will start with 5 mg twice daily, can further increase by 5 mg weekly, not to exceed 60 mg in a given day.  We will recheck symptoms in 3 months.

## 2022-05-07 NOTE — Patient Instructions (Signed)
-   Continue current sertraine 50 mg daily dose - Continue propranolol 1-2 / day - Start buspirone 5 mg twice daily - Can increase by 5 mg weekly until symptoms fully controlled - Do not exceed 60 mg in a total tday - Reserve hydroxyzine for breakthrough anxiety symptoms - Return for in-office visit in 3 months

## 2022-05-07 NOTE — Progress Notes (Signed)
Primary Care / Sports Medicine Virtual Visit  Patient Information:  Patient ID: Richard Donovan, male DOB: 09/15/1985 Age: 36 y.o. MRN: [id number]   Richard Donovan is a pleasant 36 y.o. male presenting with the following:  Chief Complaint  Patient presents with   Anxiety and depression    Review of Systems: No fevers, chills, night sweats, weight loss, chest pain, or shortness of breath.   Patient Active Problem List   Diagnosis Date Noted   Annual physical exam 12/10/2021   Chronic pain of both shoulders 11/04/2021   Sleep apnea    Biceps tendinitis 10/24/2021   Anxiety and depression 10/04/2013   Environmental and seasonal allergies 10/04/1998   Past Medical History:  Diagnosis Date   Allergies    Allergy    Anxiety    Family history of adverse reaction to anesthesia    Mother - slow to wake   Sleep apnea    CPAP   Outpatient Encounter Medications as of 05/07/2022  Medication Sig   busPIRone (BUSPAR) 5 MG tablet Take 1 tablet (5 mg) by mouth twice daily. After 1 week can increase by 1 tablet (5 mg) weekly. Do not exceed 60 mg / day.   sertraline (ZOLOFT) 50 MG tablet TAKE 1 TABLET BY MOUTH EVERY DAY   [DISCONTINUED] propranolol (INDERAL) 10 MG tablet TAKE 2 TABLETS BY MOUTH EVERY DAY   hydrOXYzine (VISTARIL) 25 MG capsule Take 1-2 capsules (25-50 mg total) by mouth every 8 (eight) hours as needed for anxiety. (Patient not taking: Reported on 05/07/2022)   propranolol (INDERAL) 10 MG tablet Take 2 tablets (20 mg total) by mouth daily.   No facility-administered encounter medications on file as of 05/07/2022.   Past Surgical History:  Procedure Laterality Date   ADENOIDECTOMY     EAR TUBE REMOVAL     ETHMOIDECTOMY Left 06/26/2021   Procedure: ANTERIOR ETHMOIDECTOMY;  Surgeon: Bud Face, MD;  Location: [facility];  Service: ENT;  Laterality: Left;  sleep apnea   FRONTAL SINUS EXPLORATION Left 06/26/2021   Procedure: FRONTAL SINUS EXPLORATION;  Surgeon:  Bud Face, MD;  Location: [facility];  Service: ENT;  Laterality: Left;   IMAGE GUIDED SINUS SURGERY N/A 06/26/2021   Procedure: IMAGE GUIDED SINUS SURGERY;  Surgeon: Bud Face, MD;  Location: [facility];  Service: ENT;  Laterality: N/A;  need stryker disk placed disk on OR CHARGE nurse desk 10-11 kp   MAXILLARY ANTROSTOMY Bilateral 06/26/2021   Procedure: MAXILLARY ANTROSTOMY WITH TISSUE REMOVAL;  Surgeon: Bud Face, MD;  Location: [facility];  Service: ENT;  Laterality: Bilateral;   NASAL SEPTOPLASTY W/ TURBINOPLASTY Bilateral 06/26/2021   Procedure: NASAL SEPTOPLASTY WITH INFERIOR TURBINATE REDUCTION;  Surgeon: Bud Face, MD;  Location: [facility];  Service: ENT;  Laterality: Bilateral;   TYMPANOSTOMY TUBE PLACEMENT Bilateral    VASECTOMY  03/19/2022   WISDOM TOOTH EXTRACTION      Virtual Visit via MyChart Video:   I connected with [name] on 05/07/22 via MyChart Video and verified that I am speaking with the correct person using appropriate identifiers.   The limitations, risks, security and privacy concerns of performing an evaluation and management service by MyChart Video, including the higher likelihood of inaccurate diagnoses and treatments, and the availability of in person appointments were reviewed. The possible need of an additional face-to-face encounter for complete and high quality delivery of care was discussed. The patient was also made aware that there may be a patient  responsible charge related to this service. The patient expressed understanding and wishes to proceed.  Provider location is in medical facility. Patient location is at their home, different from provider location. People involved in care of the patient during this telehealth encounter were myself, my nurse/medical assistant, and my front office/scheduling team member.  Objective findings:   General: Speaking full sentences, no audible  heavy breathing. Sounds alert and appropriately interactive. Well-appearing. Face symmetric. Extraocular movements intact. Pupils equal and round. No nasal flaring or accessory muscle use visualized.  Independent interpretation of notes and tests performed by another provider:   None  Pertinent History, Exam, Impression, and Recommendations:   Anxiety and depression Did end up going down to 25 mg Zoloft x roughly 2 weeks then has been up to 50 mg since, tolerating well. Sleeping well, decreased appetite leading to wanted weight loss, no energy issues. No libido issues, but some degree anorgasmia.  Discussed various methods to address this issue, consideration of PDE 5 inhibitors, other medications.  He is amenable to further addition buspirone 5 mg for augmentation, understands limited data for addressing the issues he brings up however should further help mood component.  He will start with 5 mg twice daily, can further increase by 5 mg weekly, not to exceed 60 mg in a given day.  We will recheck symptoms in 3 months.  Orders & Medications Meds ordered this encounter  Medications   propranolol (INDERAL) 10 MG tablet    Sig: Take 2 tablets (20 mg total) by mouth daily.    Dispense:  180 tablet    Refill:  0   busPIRone (BUSPAR) 5 MG tablet    Sig: Take 1 tablet (5 mg) by mouth twice daily. After 1 week can increase by 1 tablet (5 mg) weekly. Do not exceed 60 mg / day.    Dispense:  180 tablet    Refill:  0   No orders of the defined types were placed in this encounter.    I discussed the above assessment and treatment plan with the patient. The patient was provided an opportunity to ask questions and all were answered. The patient agreed with the plan and demonstrated an understanding of the instructions.   The patient was advised to call back or seek an in-person evaluation if the symptoms worsen or if the condition fails to improve as anticipated.   I provided a total time of 30  minutes including both face-to-face and non-face-to-face time on 05/07/2022 inclusive of time utilized for medical chart review, information gathering, care coordination with staff, and documentation completion.    [name], MD   Primary Care Sports Medicine [facility]

## 2022-05-07 NOTE — Assessment & Plan Note (Signed)
>>  ASSESSMENT AND PLAN FOR ANXIETY AND DEPRESSION WRITTEN ON 05/07/2022  9:20 AM BY Larwence Tu, Ocie Bob, MD  Did end up going down to 25 mg Zoloft x roughly 2 weeks then has been up to 50 mg since, tolerating well. Sleeping well, decreased appetite leading to wanted weight loss, no energy issues. No libido issues, but some degree anorgasmia.  Discussed various methods to address this issue, consideration of PDE 5 inhibitors, other medications.  He is amenable to further addition buspirone 5 mg for augmentation, understands limited data for addressing the issues he brings up however should further help mood component.  He will start with 5 mg twice daily, can further increase by 5 mg weekly, not to exceed 60 mg in a given day.  We will recheck symptoms in 3 months.

## 2022-05-09 ENCOUNTER — Telehealth: Payer: Federal, State, Local not specified - PPO | Admitting: Family Medicine

## 2022-05-09 DIAGNOSIS — J029 Acute pharyngitis, unspecified: Secondary | ICD-10-CM

## 2022-05-09 MED ORDER — AMOXICILLIN 500 MG PO TABS: 500.0000 mg | ORAL_TABLET | Freq: Two times a day (BID) | ORAL | 0 refills | Status: AC

## 2022-05-09 NOTE — Patient Instructions (Signed)
Richard Donovan, thank you for joining Freddy Finner, NP for today's virtual visit.  While this provider is not your primary care provider (PCP), if your PCP is located in our provider database this encounter information will be shared with them immediately following your visit.  Consent: (Patient) Richard Donovan provided verbal consent for this virtual visit at the beginning of the encounter.  Current Medications:  Current Outpatient Medications:    amoxicillin (AMOXIL) 500 MG tablet, Take 1 tablet (500 mg total) by mouth 2 (two) times daily for 10 days., Disp: 20 tablet, Rfl: 0   busPIRone (BUSPAR) 5 MG tablet, Take 1 tablet (5 mg) by mouth twice daily. After 1 week can increase by 1 tablet (5 mg) weekly. Do not exceed 60 mg / day., Disp: 180 tablet, Rfl: 0   hydrOXYzine (VISTARIL) 25 MG capsule, Take 1-2 capsules (25-50 mg total) by mouth every 8 (eight) hours as needed for anxiety. (Patient not taking: Reported on 05/07/2022), Disp: 30 capsule, Rfl: 0   propranolol (INDERAL) 10 MG tablet, Take 2 tablets (20 mg total) by mouth daily., Disp: 180 tablet, Rfl: 0   sertraline (ZOLOFT) 50 MG tablet, TAKE 1 TABLET BY MOUTH EVERY DAY, Disp: 90 tablet, Rfl: 1   Medications ordered in this encounter:  Meds ordered this encounter  Medications   amoxicillin (AMOXIL) 500 MG tablet    Sig: Take 1 tablet (500 mg total) by mouth 2 (two) times daily for 10 days.    Dispense:  20 tablet    Refill:  0    Order Specific Question:   Supervising Provider    Answer:   Hyacinth Meeker, BRIAN [[id number]]     *If you need refills on other medications prior to your next appointment, please contact your pharmacy*  Follow-Up: Call back or seek an in-person evaluation if the symptoms worsen or if the condition fails to improve as anticipated.  Other Instructions Strep Throat, Adult Strep throat is an infection in the throat that is caused by bacteria. It is common during the cold months of the year. It mostly affects children  who are 20-73 years old. However, people of all ages can get it at any time of the year. This infection spreads from person to person (is contagious) through coughing, sneezing, or having close contact. Your health care provider may use other names to describe the infection. When strep throat affects the tonsils, it is called tonsillitis. When it affects the back of the throat, it is called pharyngitis. What are the causes? This condition is caused by the Streptococcus pyogenes bacteria. What increases the risk? You are more likely to develop this condition if: You care for school-age children, or are around school-age children. Children are more likely to get strep throat and may spread it to others. You spend time in crowded places where the infection can spread easily. You have close contact with someone who has strep throat. What are the signs or symptoms? Symptoms of this condition include: Fever or chills. Redness, swelling, or pain in the tonsils or throat. Pain or difficulty when swallowing. White or yellow spots on the tonsils or throat. Tender glands in the neck and under the jaw. Bad smelling breath. Red rash all over the body. This is rare. How is this diagnosed? This condition is diagnosed by tests that check for the presence and the amount of bacteria that cause strep throat. They are: Rapid strep test. Your throat is swabbed and checked for the presence of bacteria. Results  are usually ready in minutes. Throat culture test. Your throat is swabbed. The sample is placed in a cup that allows infections to grow. Results are usually ready in 1 or 2 days. How is this treated? This condition may be treated with: Medicines that kill germs (antibiotics). Medicines that relieve pain or fever. These include: Ibuprofen or acetaminophen. Aspirin, only for people who are over the age of 7. Throat lozenges. Throat sprays. Follow these instructions at home: Medicines  Take  over-the-counter and prescription medicines only as told by your health care provider. Take your antibiotic medicine as told by your health care provider. Do not stop taking the antibiotic even if you start to feel better. Eating and drinking  If you have trouble swallowing, try eating soft foods until your sore throat feels better. Drink enough fluid to keep your urine pale yellow. To help relieve pain, you may have: Warm fluids, such as soup and tea. Cold fluids, such as frozen desserts or popsicles. General instructions Gargle with a salt-water mixture 3-4 times a day or as needed. To make a salt-water mixture, completely dissolve -1 tsp (3-6 g) of salt in 1 cup (237 mL) of warm water. Get plenty of rest. Stay home from work or school until you have been taking antibiotics for 24 hours. Do not use any products that contain nicotine or tobacco. These products include cigarettes, chewing tobacco, and vaping devices, such as e-cigarettes. If you need help quitting, ask your health care provider. It is up to you to get your test results. Ask your health care provider, or the department that is doing the test, when your results will be ready. Keep all follow-up visits. This is important. How is this prevented?  Do not share food, drinking cups, or personal items that could cause the infection to spread to other people. Wash your hands often with soap and water for at least 20 seconds. If soap and water are not available, use hand sanitizer. Make sure that all people in your house wash their hands well. Have family members tested if they have a sore throat or fever. They may need an antibiotic if they have strep throat. Contact a health care provider if: You have swelling in your neck that keeps getting bigger. You develop a rash, cough, or earache. You cough up a thick mucus that is green, yellow-brown, or bloody. You have pain or discomfort that does not get better with medicine. Your  symptoms seem to be getting worse. You have a fever. Get help right away if: You have new symptoms, such as vomiting, severe headache, stiff or painful neck, chest pain, or shortness of breath. You have severe throat pain, drooling, or changes in your voice. You have swelling of the neck, or the skin on the neck becomes red and tender. You have signs of dehydration, such as tiredness (fatigue), dry mouth, and decreased urination. You become increasingly sleepy, or you cannot wake up completely. Your joints become red or painful. These symptoms may represent a serious problem that is an emergency. Do not wait to see if the symptoms will go away. Get medical help right away. Call your local emergency services (911 in the [place] Do not drive yourself to the hospital. Summary Strep throat is an infection in the throat that is caused by the Streptococcus pyogenes bacteria. This infection is spread from person to person (is contagious) through coughing, sneezing, or having close contact. Take your medicines, including antibiotics, as told by your health care  provider. Do not stop taking the antibiotic even if you start to feel better. To prevent the spread of germs, wash your hands well with soap and water. Have others do the same. Do not share food, drinking cups, or personal items. Get help right away if you have new symptoms, such as vomiting, severe headache, stiff or painful neck, chest pain, or shortness of breath. This information is not intended to replace advice given to you by your health care provider. Make sure you discuss any questions you have with your health care provider. Document Revised: 12/18/2020 Document Reviewed: 12/18/2020 Elsevier Patient Education  2023 Elsevier Inc.   If you have been instructed to have an in-person evaluation today at a local Urgent Care facility, please use the link below. It will take you to a list of all of our available [facility],  including address, phone number and hours of operation. Please do not delay care.  [facility]  If you or a family member do not have a primary care provider, use the link below to schedule a visit and establish care. When you choose a [facility] primary care physician or advanced practice provider, you gain a long-term partner in health. Find a Primary Care Provider  Learn more about [facility]'s in-office and virtual care options: [facility] - Get Care Now

## 2022-05-09 NOTE — Progress Notes (Signed)
Virtual Visit Consent   Richard Donovan, you are scheduled for a virtual visit with a [facility] provider today. Just as with appointments in the office, your consent must be obtained to participate. Your consent will be active for this visit and any virtual visit you may have with one of our providers in the next 365 days. If you have a MyChart account, a copy of this consent can be sent to you electronically.  As this is a virtual visit, video technology does not allow for your provider to perform a traditional examination. This may limit your provider's ability to fully assess your condition. If your provider identifies any concerns that need to be evaluated in person or the need to arrange testing (such as labs, EKG, etc.), we will make arrangements to do so. Although advances in technology are sophisticated, we cannot ensure that it will always work on either your end or our end. If the connection with a video visit is poor, the visit may have to be switched to a telephone visit. With either a video or telephone visit, we are not always able to ensure that we have a secure connection.  By engaging in this virtual visit, you consent to the provision of healthcare and authorize for your insurance to be billed (if applicable) for the services provided during this visit. Depending on your insurance coverage, you may receive a charge related to this service.  I need to obtain your verbal consent now. Are you willing to proceed with your visit today? Richard Donovan has provided verbal consent on 05/09/2022 for a virtual visit (video or telephone). Freddy Finner, NP  Date: 05/09/2022 10:06 AM  Virtual Visit via Video Note   I, Freddy Finner, connected with  Richard Donovan  ([id number], 07-21-1986) on 05/09/22 at 11:00 AM EDT by a video-enabled telemedicine application and verified that I am speaking with the correct person using two identifiers.  Location: Patient: Virtual Visit Location Patient:  Home Provider: Virtual Visit Location Provider: Home Office   I discussed the limitations of evaluation and management by telemedicine and the availability of in person appointments. The patient expressed understanding and agreed to proceed.    History of Present Illness: Richard Donovan is a 36 y.o. who identifies as a male who was assigned male at birth, and is being seen today for sore throat that is scratchy and some sinus pressure. Reports red and swollen throat. Ear pain on both sides. Wife positive for strep. Denies fevers, chills, chest pain, shortness of breath.    Problems:  Patient Active Problem List   Diagnosis Date Noted   Annual physical exam 12/10/2021   Chronic pain of both shoulders 11/04/2021   Sleep apnea    Biceps tendinitis 10/24/2021   Anxiety and depression 10/04/2013   Environmental and seasonal allergies 10/04/1998    Allergies:  Allergies  Allergen Reactions   Sudafed [Pseudoephedrine] Palpitations   Medications:  Current Outpatient Medications:    busPIRone (BUSPAR) 5 MG tablet, Take 1 tablet (5 mg) by mouth twice daily. After 1 week can increase by 1 tablet (5 mg) weekly. Do not exceed 60 mg / day., Disp: 180 tablet, Rfl: 0   hydrOXYzine (VISTARIL) 25 MG capsule, Take 1-2 capsules (25-50 mg total) by mouth every 8 (eight) hours as needed for anxiety. (Patient not taking: Reported on 05/07/2022), Disp: 30 capsule, Rfl: 0   propranolol (INDERAL) 10 MG tablet, Take 2 tablets (20 mg total) by mouth daily., Disp: 180 tablet, Rfl:  0   sertraline (ZOLOFT) 50 MG tablet, TAKE 1 TABLET BY MOUTH EVERY DAY, Disp: 90 tablet, Rfl: 1  Observations/Objective: Patient is well-developed, well-nourished in no acute distress.  Resting comfortably  at home.  Head is normocephalic, atraumatic.  No labored breathing.  Speech is clear and coherent with logical content.  Patient is alert and oriented at baseline.    Assessment and Plan:  1. Pharyngitis, unspecified  etiology  - amoxicillin (AMOXIL) 500 MG tablet; Take 1 tablet (500 mg total) by mouth 2 (two) times daily for 10 days.  Dispense: 20 tablet; Refill: 0  -rest -hydrate -take medication as prescribed -salf water gargles and OTC as needed info on avs   Reviewed side effects, risks and benefits of medication.    Patient acknowledged agreement and understanding of the plan.   Past Medical, Surgical, Social History, Allergies, and Medications have been Reviewed.    Follow Up Instructions: I discussed the assessment and treatment plan with the patient. The patient was provided an opportunity to ask questions and all were answered. The patient agreed with the plan and demonstrated an understanding of the instructions.  A copy of instructions were sent to the patient via MyChart unless otherwise noted below.     The patient was advised to call back or seek an in-person evaluation if the symptoms worsen or if the condition fails to improve as anticipated.  Time:  I spent 8 minutes with the patient via telehealth technology discussing the above problems/concerns.    Freddy Finner, NP

## 2022-06-02 DIAGNOSIS — G4733 Obstructive sleep apnea (adult) (pediatric): Secondary | ICD-10-CM | POA: Diagnosis not present

## 2022-06-05 ENCOUNTER — Other Ambulatory Visit: Payer: Self-pay

## 2022-06-05 ENCOUNTER — Encounter: Payer: Self-pay | Admitting: Family Medicine

## 2022-06-05 ENCOUNTER — Other Ambulatory Visit: Payer: Self-pay | Admitting: Family Medicine

## 2022-06-05 DIAGNOSIS — F411 Generalized anxiety disorder: Secondary | ICD-10-CM

## 2022-06-05 MED ORDER — PROPRANOLOL HCL 10 MG PO TABS: 20.0000 mg | ORAL_TABLET | Freq: Every day | ORAL | 0 refills | Status: DC

## 2022-06-05 NOTE — Telephone Encounter (Signed)
Please review.  KP

## 2022-06-10 ENCOUNTER — Other Ambulatory Visit: Payer: Self-pay | Admitting: Family Medicine

## 2022-06-10 DIAGNOSIS — F411 Generalized anxiety disorder: Secondary | ICD-10-CM

## 2022-06-11 MED ORDER — BUSPIRONE HCL 5 MG PO TABS: ORAL_TABLET | ORAL | 0 refills | Status: DC

## 2022-07-02 DIAGNOSIS — G4733 Obstructive sleep apnea (adult) (pediatric): Secondary | ICD-10-CM | POA: Diagnosis not present

## 2022-07-04 DIAGNOSIS — G4733 Obstructive sleep apnea (adult) (pediatric): Secondary | ICD-10-CM | POA: Diagnosis not present

## 2022-07-06 ENCOUNTER — Other Ambulatory Visit: Payer: Self-pay | Admitting: Family Medicine

## 2022-07-06 DIAGNOSIS — F411 Generalized anxiety disorder: Secondary | ICD-10-CM

## 2022-07-07 NOTE — Telephone Encounter (Signed)
Requested medication (s) are due for refill today: yes  Requested medication (s) are on the active medication list: yes  Last refill:  06/11/22  Future visit scheduled: yes  Notes to clinic:  [place]. DX Code Needed     Requested Prescriptions  Pending Prescriptions Disp Refills   busPIRone (BUSPAR) 5 MG tablet [Pharmacy Med Name: BUSPIRONE HCL 5 MG TABLET] 540 tablet 1    Sig: Take 1 tablet (5 mg) by mouth twice daily. After 1 week can increase by 1 tablet (5 mg) weekly. Do not exceed 60 mg / day.     Psychiatry: Anxiolytics/Hypnotics - Non-controlled Passed - 07/06/2022 11:33 AM      Passed - Valid encounter within last 12 months    Recent Outpatient Visits           2 months ago Anxiety, generalized   [facility] [place], [place]   3 months ago Anxiety and depression   [place] [facility] [place], Earley Abide, MD   6 months ago Annual physical exam   [facility] at [facility], Earley Abide, MD   8 months ago Chronic pain of both shoulders   [facility] at [facility], Earley Abide, MD       Future Appointments             In 1 month Zigmund Daniel, Earley Abide, MD [facility] and [facility] Medicine at [facility], [facility]   In 5 months Zigmund Daniel, Earley Abide, MD [facility] and [facility] Medicine at [facility], [facility]

## 2022-08-02 DIAGNOSIS — G4733 Obstructive sleep apnea (adult) (pediatric): Secondary | ICD-10-CM | POA: Diagnosis not present

## 2022-08-03 ENCOUNTER — Other Ambulatory Visit: Payer: Self-pay | Admitting: Family Medicine

## 2022-08-03 DIAGNOSIS — F411 Generalized anxiety disorder: Secondary | ICD-10-CM

## 2022-08-05 NOTE — Telephone Encounter (Signed)
Refill? Pt has appointment in 2 days

## 2022-08-05 NOTE — Telephone Encounter (Signed)
Requested medication (s) are due for refill today: Yes  Requested medication (s) are on the active medication list: Yes  Last refill:  06/11/22  Future visit scheduled: Yes  Notes to clinic:  Unable to refill per protocol due to instructions are increasing dosage, will need new Rx sent for the daily dosage needed     Requested Prescriptions  Pending Prescriptions Disp Refills   busPIRone (BUSPAR) 5 MG tablet [Pharmacy Med Name: BUSPIRONE HCL 5 MG TABLET] 180 tablet 0    Sig: Take 1 tablet (5 mg) by mouth twice daily. After 1 week can increase by 1 tablet (5 mg) weekly. Do not exceed 60 mg / day.     Psychiatry: Anxiolytics/Hypnotics - Non-controlled Passed - 08/03/2022  9:41 PM      Passed - Valid encounter within last 12 months    Recent Outpatient Visits           3 months ago Anxiety, generalized   [facility], Ocie Bob, MD   4 months ago Anxiety and depression   [place] [facility] at [facility], Ocie Bob, MD   7 months ago Annual physical exam   [facility] at [facility], Ocie Bob, MD   9 months ago Chronic pain of both shoulders   [facility] and [facility] Medicine at [facility], Ocie Bob, MD       Future Appointments             In 2 days Ashley Royalty, Ocie Bob, MD [facility] Primary Care and Sports Medicine at [facility], [facility]   In 4 months Ashley Royalty, Ocie Bob, MD [facility] and [facility] Medicine at [facility], [facility]

## 2022-08-07 ENCOUNTER — Ambulatory Visit: Payer: 59 | Admitting: Family Medicine

## 2022-08-07 ENCOUNTER — Encounter: Payer: Self-pay | Admitting: Family Medicine

## 2022-08-07 ENCOUNTER — Telehealth (INDEPENDENT_AMBULATORY_CARE_PROVIDER_SITE_OTHER): Payer: 59 | Admitting: Family Medicine

## 2022-08-07 DIAGNOSIS — F411 Generalized anxiety disorder: Secondary | ICD-10-CM

## 2022-08-07 DIAGNOSIS — F32A Depression, unspecified: Secondary | ICD-10-CM | POA: Diagnosis not present

## 2022-08-07 DIAGNOSIS — F419 Anxiety disorder, unspecified: Secondary | ICD-10-CM

## 2022-08-07 MED ORDER — BUSPIRONE HCL 5 MG PO TABS: 10.0000 mg | ORAL_TABLET | Freq: Two times a day (BID) | ORAL | 1 refills | Status: DC

## 2022-08-07 MED ORDER — SERTRALINE HCL 50 MG PO TABS: 50.0000 mg | ORAL_TABLET | Freq: Every day | ORAL | 1 refills | Status: DC

## 2022-08-07 MED ORDER — PROPRANOLOL HCL 10 MG PO TABS: 20.0000 mg | ORAL_TABLET | Freq: Two times a day (BID) | ORAL | 1 refills | Status: DC | PRN

## 2022-08-07 NOTE — Assessment & Plan Note (Signed)
>>  ASSESSMENT AND PLAN FOR ANXIETY AND DEPRESSION WRITTEN ON 08/07/2022  8:46 AM BY [place] Rogacki, Ocie Bob, MD  Patient has noted excellent control with current medication regimen, currently dosing to buspirone (10 mg total) every a.m. and every p.m. scheduled, continues sertraline 50 mg, and reserves propranolol for as needed dosing for anxiety attacks.  He has not had any issues and finds that overall he is doing much better, PHQ and GAD scores reflect this.    From a medication management standpoint, we discussed the utility of propranolol versus buspirone for adjunct anxiety dosing.  We will trial an additional buspirone 5 mg as needed and if ineffective, return to propranolol as needed dosing.  He will return for follow-up reevaluation in 3 months for his annual physical.

## 2022-08-07 NOTE — Patient Instructions (Addendum)
-   Continue Zoloft 50 mg daily - Continue buspirone 10 mg twice daily - Continue propranolol twice daily as needed - For anxiety episodes, can trial 1 additional tablet buspirone (5 mg) - If effective, can continue this strategy - If ineffective, dose propranolol as you have been - Return for annual physical mid February 2024 - Contact us for any questions between now and then

## 2022-08-07 NOTE — Progress Notes (Signed)
Primary Care / Sports Medicine Virtual Visit  Patient Information:  Patient ID: Richard Donovan, male DOB: 06-12-1986 Age: 36 y.o. MRN: [id number]   Richard Donovan is a pleasant 36 y.o. male presenting with the following:  Chief Complaint  Patient presents with   Anxiety and depression    Review of Systems: No fevers, chills, night sweats, weight loss, chest pain, or shortness of breath.   Patient Active Problem List   Diagnosis Date Noted   Annual physical exam 12/10/2021   Chronic pain of both shoulders 11/04/2021   Sleep apnea    Biceps tendinitis 10/24/2021   Anxiety and depression 10/04/2013   Environmental and seasonal allergies 10/04/1998   Past Medical History:  Diagnosis Date   Allergies    Allergy    Anxiety    Family history of adverse reaction to anesthesia    Mother - slow to wake   Sleep apnea    CPAP   Outpatient Encounter Medications as of 08/07/2022  Medication Sig   [DISCONTINUED] busPIRone (BUSPAR) 5 MG tablet Take 1 tablet (5 mg) by mouth twice daily. After 1 week can increase by 1 tablet (5 mg) weekly. Do not exceed 60 mg / day.   [DISCONTINUED] propranolol (INDERAL) 10 MG tablet Take 2 tablets (20 mg total) by mouth daily.   [DISCONTINUED] sertraline (ZOLOFT) 50 MG tablet TAKE 1 TABLET BY MOUTH EVERY DAY   busPIRone (BUSPAR) 5 MG tablet Take 2 tablets (10 mg total) by mouth 2 (two) times daily. Take 1 tablet (5 mg) by mouth twice daily. After 1 week can increase by 1 tablet (5 mg) weekly. Do not exceed 60 mg / day.   hydrOXYzine (VISTARIL) 25 MG capsule Take 1-2 capsules (25-50 mg total) by mouth every 8 (eight) hours as needed for anxiety. (Patient not taking: Reported on 08/07/2022)   propranolol (INDERAL) 10 MG tablet Take 2 tablets (20 mg total) by mouth 2 (two) times daily as needed.   sertraline (ZOLOFT) 50 MG tablet Take 1 tablet (50 mg total) by mouth daily.   No facility-administered encounter medications on file as of 08/07/2022.   Past  Surgical History:  Procedure Laterality Date   ADENOIDECTOMY     EAR TUBE REMOVAL     ETHMOIDECTOMY Left 06/26/2021   Procedure: ANTERIOR ETHMOIDECTOMY;  Surgeon: Bud Face, MD;  Location: [facility];  Service: ENT;  Laterality: Left;  sleep apnea   FRONTAL SINUS EXPLORATION Left 06/26/2021   Procedure: FRONTAL SINUS EXPLORATION;  Surgeon: Bud Face, MD;  Location: [facility];  Service: ENT;  Laterality: Left;   IMAGE GUIDED SINUS SURGERY N/A 06/26/2021   Procedure: IMAGE GUIDED SINUS SURGERY;  Surgeon: Bud Face, MD;  Location: [facility];  Service: ENT;  Laterality: N/A;  need [employer] disk placed disk on OR CHARGE nurse desk 10-11 kp   MAXILLARY ANTROSTOMY Bilateral 06/26/2021   Procedure: MAXILLARY ANTROSTOMY WITH TISSUE REMOVAL;  Surgeon: Bud Face, MD;  Location: [facility];  Service: ENT;  Laterality: Bilateral;   NASAL SEPTOPLASTY W/ TURBINOPLASTY Bilateral 06/26/2021   Procedure: NASAL SEPTOPLASTY WITH INFERIOR TURBINATE REDUCTION;  Surgeon: Bud Face, MD;  Location: [facility];  Service: ENT;  Laterality: Bilateral;   TYMPANOSTOMY TUBE PLACEMENT Bilateral    VASECTOMY  03/19/2022   WISDOM TOOTH EXTRACTION      Virtual Visit via MyChart Video:   I connected with Richard Donovan on 08/07/22 via MyChart Video and verified that I am speaking with the correct  person using appropriate identifiers.   The limitations, risks, security and privacy concerns of performing an evaluation and management service by MyChart Video, including the higher likelihood of inaccurate diagnoses and treatments, and the availability of in person appointments were reviewed. The possible need of an additional face-to-face encounter for complete and high quality delivery of care was discussed. The patient was also made aware that there may be a patient responsible charge related to this service. The patient expressed understanding  and wishes to proceed.  Provider location is in medical facility. Patient location is in their parked car, different from provider location. People involved in care of the patient during this telehealth encounter were myself, my nurse/medical assistant, and my front office/scheduling team member.  Objective findings:   General: Speaking full sentences, no audible heavy breathing. Sounds alert and appropriately interactive. Well-appearing. Face symmetric. Extraocular movements intact. Pupils equal and round. No nasal flaring or accessory muscle use visualized.  Independent interpretation of notes and tests performed by another provider:   None  Pertinent History, Exam, Impression, and Recommendations:   Anxiety and depression Patient has noted excellent control with current medication regimen, currently dosing to buspirone (10 mg total) every a.m. and every p.m. scheduled, continues sertraline 50 mg, and reserves propranolol for as needed dosing for anxiety attacks.  He has not had any issues and finds that overall he is doing much better, PHQ and GAD scores reflect this.    From a medication management standpoint, we discussed the utility of propranolol versus buspirone for adjunct anxiety dosing.  We will trial an additional buspirone 5 mg as needed and if ineffective, return to propranolol as needed dosing.  He will return for follow-up reevaluation in 3 months for his annual physical.    Orders & Medications Meds ordered this encounter  Medications   propranolol (INDERAL) 10 MG tablet    Sig: Take 2 tablets (20 mg total) by mouth 2 (two) times daily as needed.    Dispense:  180 tablet    Refill:  1   busPIRone (BUSPAR) 5 MG tablet    Sig: Take 2 tablets (10 mg total) by mouth 2 (two) times daily. Take 1 tablet (5 mg) by mouth twice daily. After 1 week can increase by 1 tablet (5 mg) weekly. Do not exceed 60 mg / day.    Dispense:  360 tablet    Refill:  1   sertraline (ZOLOFT) 50  MG tablet    Sig: Take 1 tablet (50 mg total) by mouth daily.    Dispense:  90 tablet    Refill:  1   No orders of the defined types were placed in this encounter.    I discussed the above assessment and treatment plan with the patient. The patient was provided an opportunity to ask questions and all were answered. The patient agreed with the plan and demonstrated an understanding of the instructions.   The patient was advised to call back or seek an in-person evaluation if the symptoms worsen or if the condition fails to improve as anticipated.   I provided a total time of 30 minutes including both face-to-face and non-face-to-face time on 08/07/2022 inclusive of time utilized for medical chart review, information gathering, care coordination with staff, and documentation completion.    Montel Culver, MD   Primary Care Sports Medicine [place]

## 2022-08-07 NOTE — Assessment & Plan Note (Addendum)
Patient has noted excellent control with current medication regimen, currently dosing to buspirone (10 mg total) every a.m. and every p.m. scheduled, continues sertraline 50 mg, and reserves propranolol for as needed dosing for anxiety attacks.  He has not had any issues and finds that overall he is doing much better, PHQ and GAD scores reflect this.    From a medication management standpoint, we discussed the utility of propranolol versus buspirone for adjunct anxiety dosing.  We will trial an additional buspirone 5 mg as needed and if ineffective, return to propranolol as needed dosing.  He will return for follow-up reevaluation in 3 months for his annual physical.

## 2022-09-01 DIAGNOSIS — G4733 Obstructive sleep apnea (adult) (pediatric): Secondary | ICD-10-CM | POA: Diagnosis not present

## 2022-10-29 ENCOUNTER — Other Ambulatory Visit: Payer: Self-pay | Admitting: Family Medicine

## 2022-10-29 DIAGNOSIS — F411 Generalized anxiety disorder: Secondary | ICD-10-CM

## 2022-10-29 NOTE — Telephone Encounter (Signed)
Last Reordered 07/31/22 #180 1 RF  Requested Prescriptions  Refused Prescriptions Disp Refills   propranolol (INDERAL) 10 MG tablet [Pharmacy Med Name: PROPRANOLOL 10 MG TABLET] 180 tablet 1    Sig: TAKE 2 TABLETS (20 MG TOTAL) BY MOUTH 2 (TWO) TIMES DAILY AS NEEDED.     Cardiovascular:  Beta Blockers Passed - 10/29/2022  7:52 AM      Passed - Last BP in normal range    BP Readings from Last 1 Encounters:  12/10/21 130/64         Passed - Last Heart Rate in normal range    Pulse Readings from Last 1 Encounters:  12/10/21 68         Passed - Valid encounter within last 6 months    Recent Outpatient Visits           2 months ago Anxiety, generalized   [place] at [place], Earley Abide, MD   5 months ago Anxiety, generalized   [place] at [place], Earley Abide, MD   7 months ago Anxiety and depression   [facility] [place] [facility] [place], Earley Abide, MD   10 months ago Annual physical exam   [place] at [facility], Earley Abide, MD   11 months ago Chronic pain of both shoulders   [place] at [facility], Earley Abide, MD       Future Appointments             In 1 month Zigmund Daniel, Earley Abide, MD [place] at [facility], [facility]

## 2022-11-06 ENCOUNTER — Other Ambulatory Visit: Payer: Self-pay

## 2022-11-06 DIAGNOSIS — F419 Anxiety disorder, unspecified: Secondary | ICD-10-CM

## 2022-11-06 IMAGING — CR DG CHEST 2V
2 series · 2 of 2 positions shown · non-contrast
Comparison: None.

CLINICAL DATA: Chest pain

EXAM:
CHEST - 2 VIEW

[chest pa]
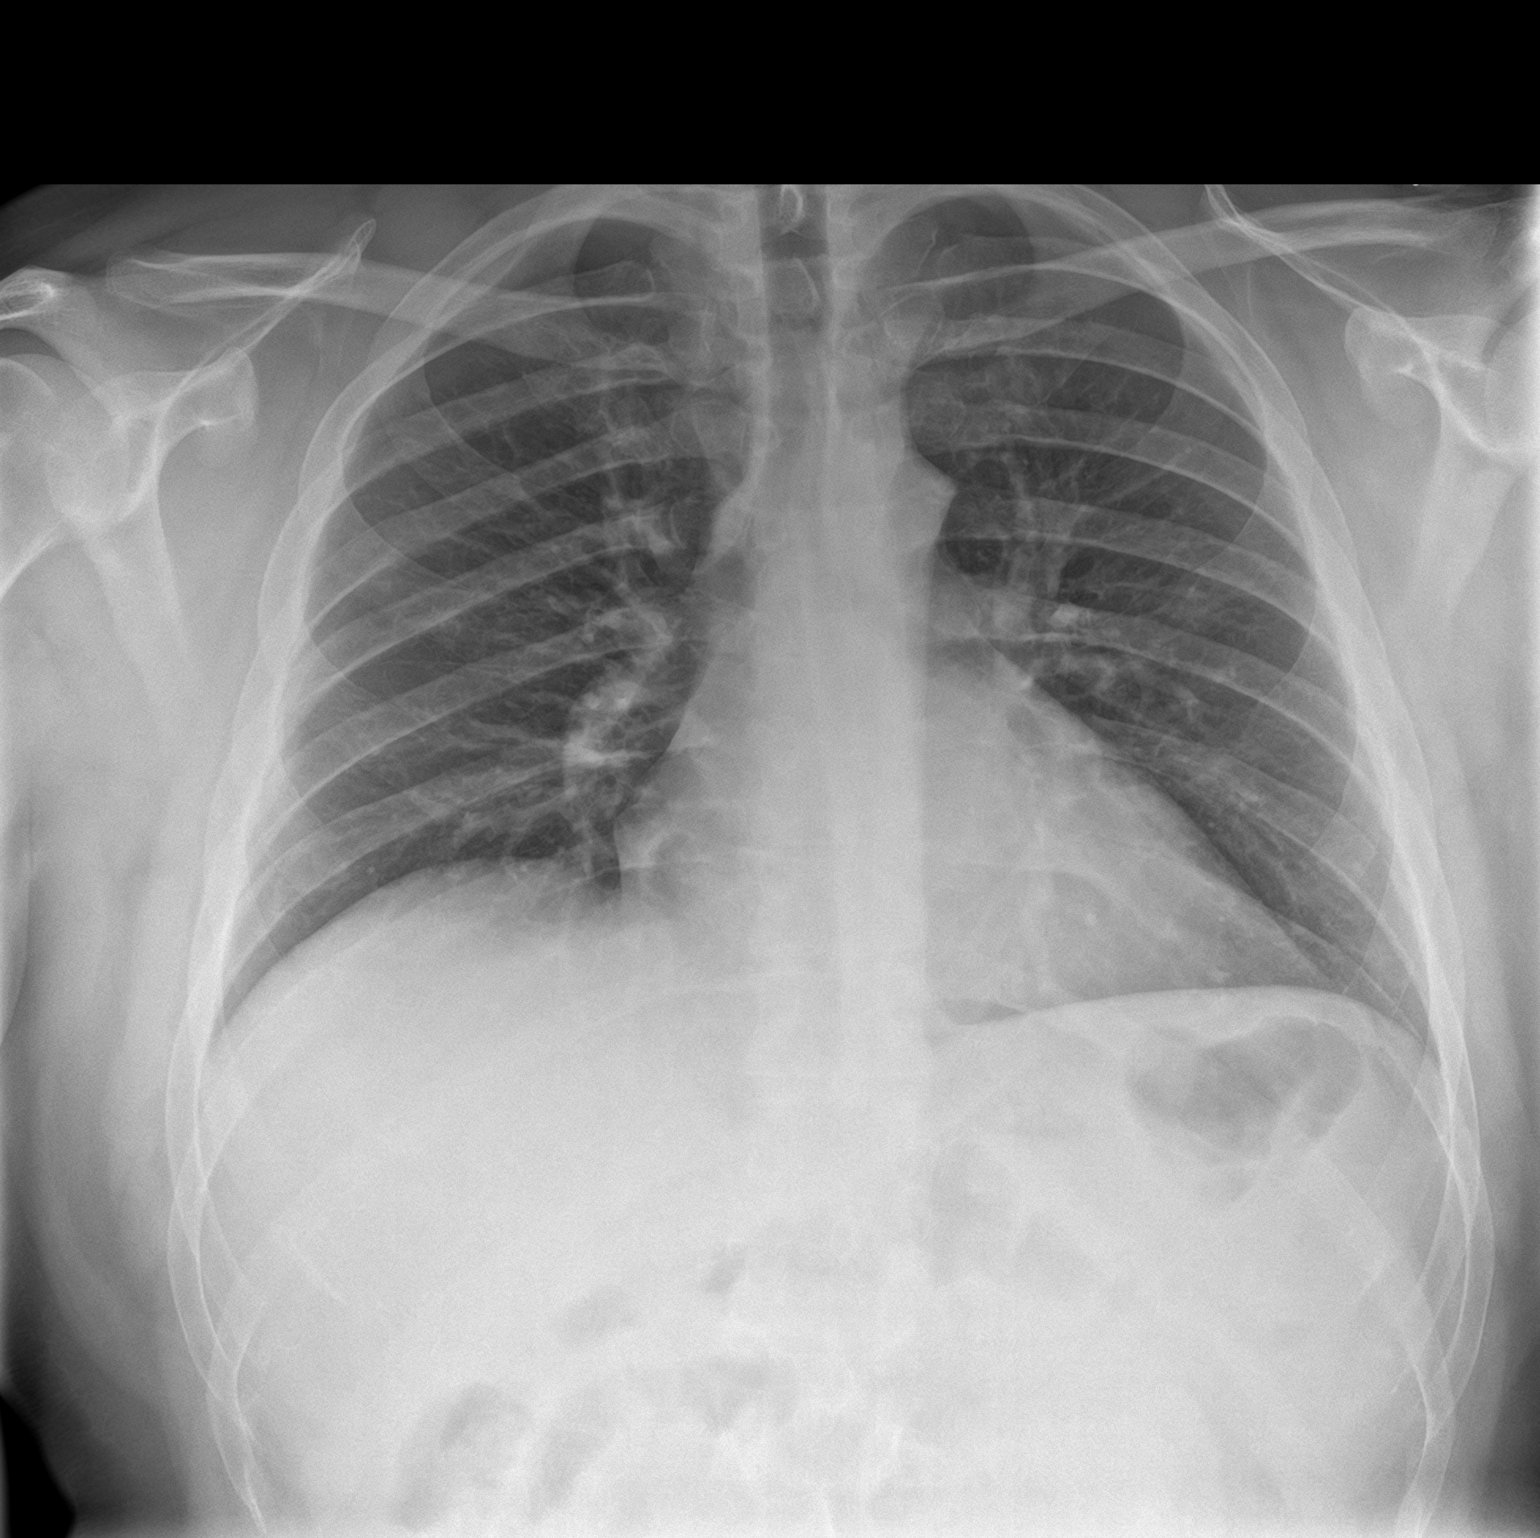

[chest lat]
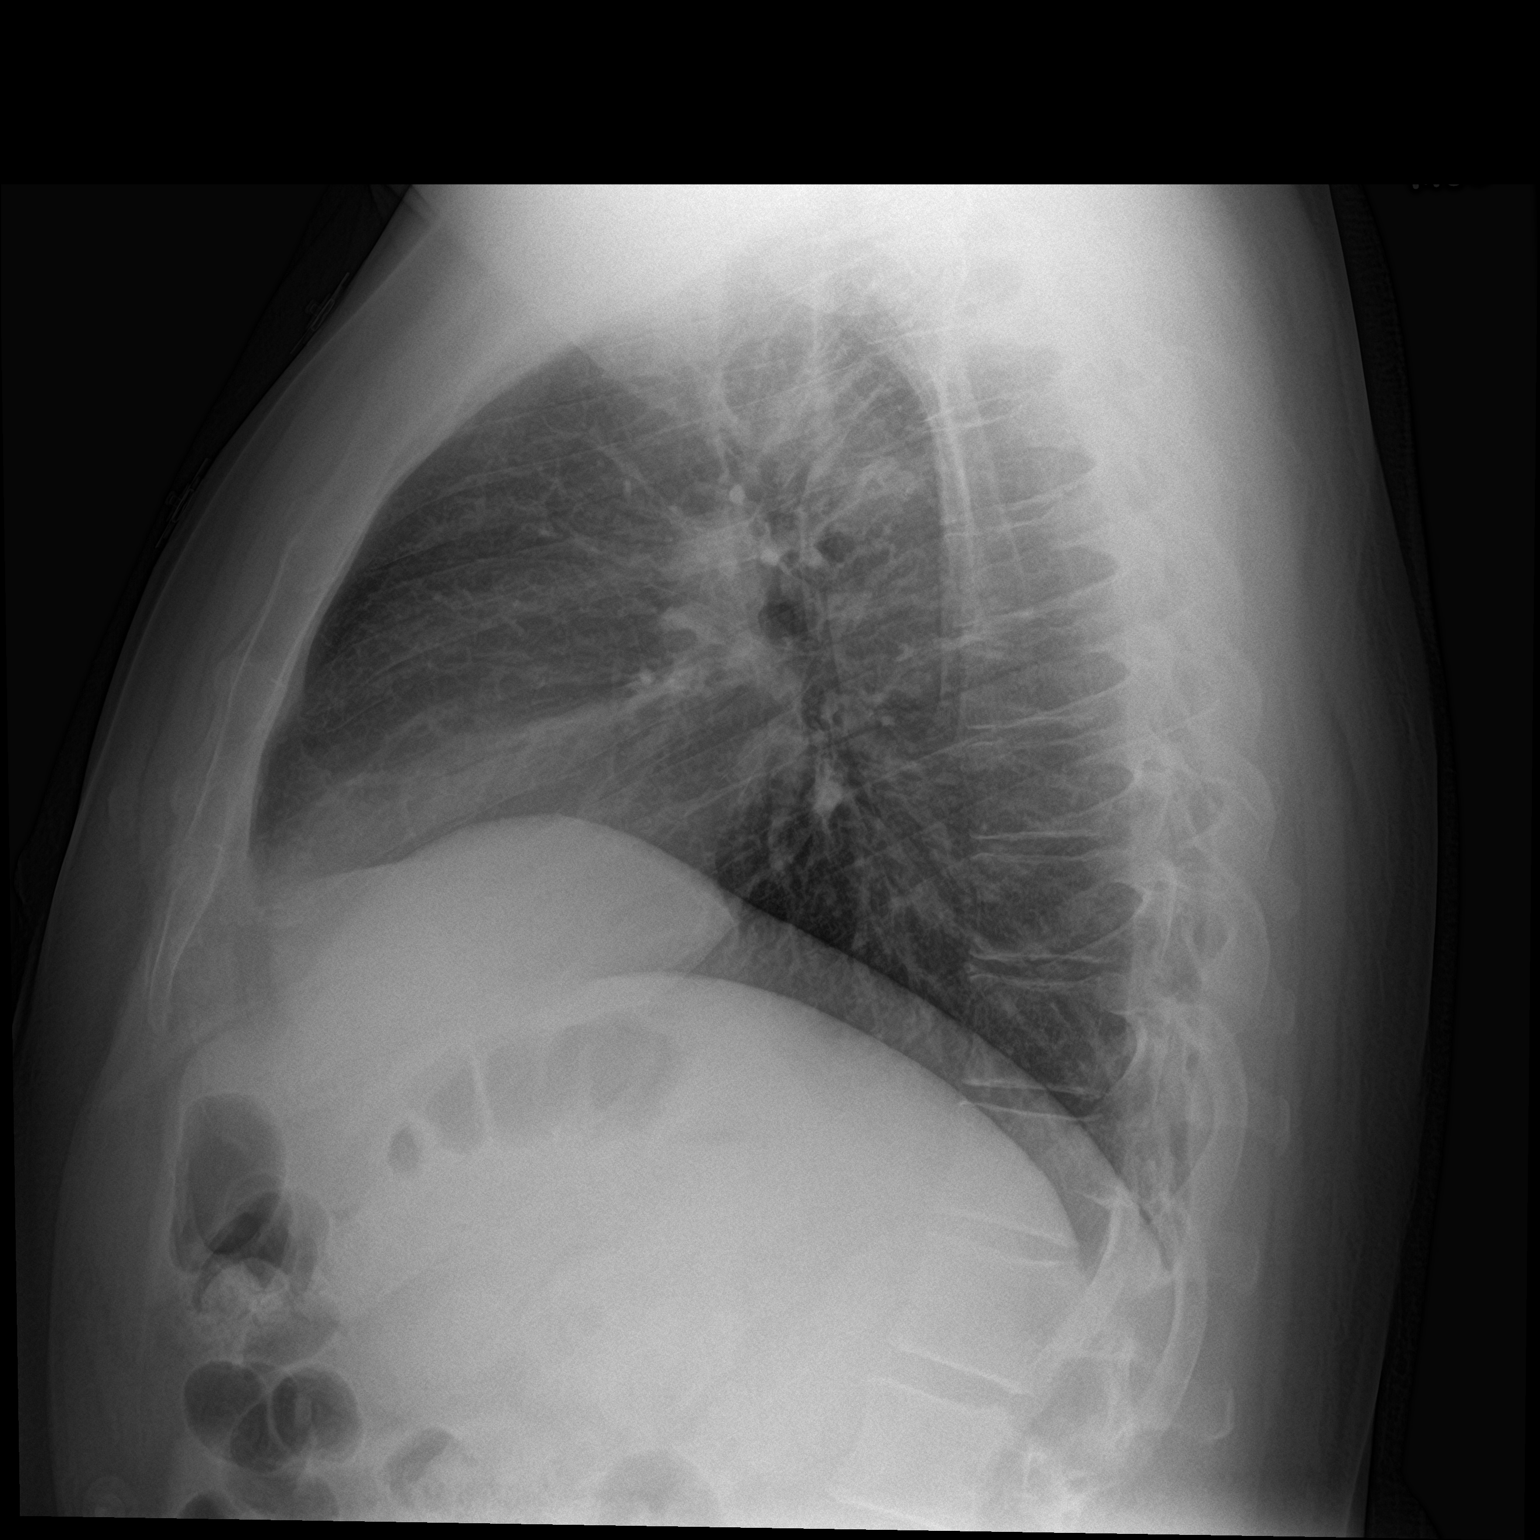

[2 of 2 positions shown; findings below may reference images not displayed]

FINDINGS: Lung volumes are low. That low lung volumes exaggerate the heart
size. No edema or effusion is present. No focal airspace disease is
present.
IMPRESSION: 1. Low lung volumes.
2. No acute cardiopulmonary disease.

## 2022-11-06 MED ORDER — SERTRALINE HCL 50 MG PO TABS: 50.0000 mg | ORAL_TABLET | Freq: Every day | ORAL | 0 refills | Status: DC

## 2022-12-05 ENCOUNTER — Telehealth: Payer: 59 | Admitting: Physician Assistant

## 2022-12-05 DIAGNOSIS — I10 Essential (primary) hypertension: Secondary | ICD-10-CM | POA: Diagnosis not present

## 2022-12-05 DIAGNOSIS — F411 Generalized anxiety disorder: Secondary | ICD-10-CM | POA: Diagnosis not present

## 2022-12-05 MED ORDER — AMLODIPINE BESYLATE 5 MG PO TABS: 5.0000 mg | ORAL_TABLET | Freq: Every day | ORAL | 0 refills | Status: DC

## 2022-12-05 MED ORDER — PROPRANOLOL HCL 10 MG PO TABS: 20.0000 mg | ORAL_TABLET | Freq: Two times a day (BID) | ORAL | 0 refills | Status: DC | PRN

## 2022-12-05 NOTE — Patient Instructions (Signed)
[place], thank you for joining Mar Daring, PA-C for today's virtual visit.  While this provider is not your primary care provider (PCP), if your PCP is located in our provider database this encounter information will be shared with them immediately following your visit.   [facility] account gives you access to today's visit and all your visits, tests, and labs performed at [facility] " click here if you don't have a [facility] account or go to [email or web address]  Consent: (Patient) Richard Donovan provided verbal consent for this virtual visit at the beginning of the encounter.  Current Medications:  Current Outpatient Medications:    amLODipine (NORVASC) 5 MG tablet, Take 1 tablet (5 mg total) by mouth daily., Disp: 30 tablet, Rfl: 0   busPIRone (BUSPAR) 5 MG tablet, Take 2 tablets (10 mg total) by mouth 2 (two) times daily. Take 1 tablet (5 mg) by mouth twice daily. After 1 week can increase by 1 tablet (5 mg) weekly. Do not exceed 60 mg / day., Disp: 360 tablet, Rfl: 1   hydrOXYzine (VISTARIL) 25 MG capsule, Take 1-2 capsules (25-50 mg total) by mouth every 8 (eight) hours as needed for anxiety. (Patient not taking: Reported on 08/07/2022), Disp: 30 capsule, Rfl: 0   propranolol (INDERAL) 10 MG tablet, Take 2 tablets (20 mg total) by mouth 2 (two) times daily as needed., Disp: 30 tablet, Rfl: 0   sertraline (ZOLOFT) 50 MG tablet, Take 1 tablet (50 mg total) by mouth daily., Disp: 90 tablet, Rfl: 0   Medications ordered in this encounter:  Meds ordered this encounter  Medications   amLODipine (NORVASC) 5 MG tablet    Sig: Take 1 tablet (5 mg total) by mouth daily.    Dispense:  30 tablet    Refill:  0    Order Specific Question:   Supervising Provider    Answer:   Chase Picket [id number]   propranolol (INDERAL) 10 MG tablet    Sig: Take 2 tablets (20 mg total) by mouth 2 (two) times daily as needed.    Dispense:  30 tablet     Refill:  0    Order Specific Question:   Supervising Provider    Answer:   Chase Picket [id number]     *If you need refills on other medications prior to your next appointment, please contact your pharmacy*  Follow-Up: Call back or seek an in-person evaluation if the symptoms worsen or if the condition fails to improve as anticipated.  [facility] [phone]  Other Instructions  DASH Eating Plan DASH stands for Dietary Approaches to Stop Hypertension. The DASH eating plan is a healthy eating plan that has been shown to: Reduce high blood pressure (hypertension). Reduce your risk for type 2 diabetes, heart disease, and stroke. Help with weight loss. What are tips for following this plan? Reading food labels Check food labels for the amount of salt (sodium) per serving. Choose foods with less than 5 percent of the Daily Value of sodium. Generally, foods with less than 300 milligrams (mg) of sodium per serving fit into this eating plan. To find whole grains, look for the word "whole" as the first word in the ingredient list. Shopping Buy products labeled as "low-sodium" or "no salt added." Buy fresh foods. Avoid canned foods and pre-made or frozen meals. Cooking Avoid adding salt when cooking. Use salt-free seasonings or herbs instead of table salt or sea salt. Check with your health  care provider or pharmacist before using salt substitutes. Do not fry foods. Cook foods using healthy methods such as baking, boiling, grilling, roasting, and broiling instead. Cook with heart-healthy oils, such as olive, canola, avocado, soybean, or sunflower oil. Meal planning  Eat a balanced diet that includes: 4 or more servings of fruits and 4 or more servings of vegetables each day. Try to fill one-half of your plate with fruits and vegetables. 6-8 servings of whole grains each day. Less than 6 oz (170 g) of lean meat, poultry, or fish each day. A 3-oz (85-g) serving of meat is  about the same size as a deck of cards. One egg equals 1 oz (28 g). 2-3 servings of low-fat dairy each day. One serving is 1 cup (237 mL). 1 serving of nuts, seeds, or beans 5 times each week. 2-3 servings of heart-healthy fats. Healthy fats called omega-3 fatty acids are found in foods such as walnuts, flaxseeds, fortified milks, and eggs. These fats are also found in cold-water fish, such as sardines, salmon, and mackerel. Limit how much you eat of: Canned or prepackaged foods. Food that is high in trans fat, such as some fried foods. Food that is high in saturated fat, such as fatty meat. Desserts and other sweets, sugary drinks, and other foods with added sugar. Full-fat dairy products. Do not salt foods before eating. Do not eat more than 4 egg yolks a week. Try to eat at least 2 vegetarian meals a week. Eat more home-cooked food and less restaurant, buffet, and fast food. Lifestyle When eating at a restaurant, ask that your food be prepared with less salt or no salt, if possible. If you drink alcohol: Limit how much you use to: 0-1 drink a day for women who are not pregnant. 0-2 drinks a day for men. Be aware of how much alcohol is in your drink. In the [place] one drink equals one 12 oz bottle of beer (355 mL), one 5 oz glass of wine (148 mL), or one 1 oz glass of hard liquor (44 mL). General information Avoid eating more than 2,300 mg of salt a day. If you have hypertension, you may need to reduce your sodium intake to 1,500 mg a day. Work with your health care provider to maintain a healthy body weight or to lose weight. Ask what an ideal weight is for you. Get at least 30 minutes of exercise that causes your heart to beat faster (aerobic exercise) most days of the week. Activities may include walking, swimming, or biking. Work with your health care provider or dietitian to adjust your eating plan to your individual calorie needs. What foods should I eat? Fruits All fresh, dried,  or frozen fruit. Canned fruit in natural juice (without added sugar). Vegetables Fresh or frozen vegetables (raw, steamed, roasted, or grilled). Low-sodium or reduced-sodium tomato and vegetable juice. Low-sodium or reduced-sodium tomato sauce and tomato paste. Low-sodium or reduced-sodium canned vegetables. Grains Whole-grain or whole-wheat bread. Whole-grain or whole-wheat pasta. Brown rice. [place]. Bulgur. Whole-grain and low-sodium cereals. Pita bread. Low-fat, low-sodium crackers. Whole-wheat flour tortillas. Meats and other proteins Skinless chicken or [place]. Ground chicken or [place]. Pork with fat trimmed off. Fish and seafood. Egg whites. Dried beans, peas, or lentils. Unsalted nuts, nut butters, and seeds. Unsalted canned beans. Lean cuts of beef with fat trimmed off. Low-sodium, lean precooked or cured meat, such as sausages or meat loaves. Dairy Low-fat (1%) or fat-free (skim) milk. Reduced-fat, low-fat, or fat-free cheeses. Nonfat, low-sodium  ricotta or cottage cheese. Low-fat or nonfat yogurt. Low-fat, low-sodium cheese. Fats and oils Soft margarine without trans fats. Vegetable oil. Reduced-fat, low-fat, or light mayonnaise and salad dressings (reduced-sodium). Canola, safflower, olive, avocado, soybean, and sunflower oils. Avocado. Seasonings and condiments Herbs. Spices. Seasoning mixes without salt. Other foods Unsalted popcorn and pretzels. Fat-free sweets. The items listed above may not be a complete list of foods and beverages you can eat. Contact a dietitian for more information. What foods should I avoid? Fruits Canned fruit in a light or heavy syrup. Fried fruit. Fruit in cream or butter sauce. Vegetables Creamed or fried vegetables. Vegetables in a cheese sauce. Regular canned vegetables (not low-sodium or reduced-sodium). Regular canned tomato sauce and paste (not low-sodium or reduced-sodium). Regular tomato and vegetable juice (not low-sodium or  reduced-sodium). Angie Fava. Olives. Grains Baked goods made with fat, such as croissants, muffins, or some breads. Dry pasta or rice meal packs. Meats and other proteins Fatty cuts of meat. Ribs. Fried meat. Berniece Salines. Bologna, salami, and other precooked or cured meats, such as sausages or meat loaves. Fat from the back of a pig (fatback). Bratwurst. Salted nuts and seeds. Canned beans with added salt. Canned or smoked fish. Whole eggs or egg yolks. Chicken or [place] with skin. Dairy Whole or 2% milk, cream, and half-and-half. Whole or full-fat cream cheese. Whole-fat or sweetened yogurt. Full-fat cheese. Nondairy creamers. Whipped toppings. Processed cheese and cheese spreads. Fats and oils Butter. Stick margarine. Lard. Shortening. Ghee. Bacon fat. Tropical oils, such as coconut, palm kernel, or palm oil. Seasonings and condiments Onion salt, garlic salt, seasoned salt, table salt, and sea salt. Worcestershire sauce. Tartar sauce. Barbecue sauce. Teriyaki sauce. Soy sauce, including reduced-sodium. Steak sauce. Canned and packaged gravies. Fish sauce. Oyster sauce. Cocktail sauce. Store-bought horseradish. Ketchup. Mustard. Meat flavorings and tenderizers. Bouillon cubes. Hot sauces. Pre-made or packaged marinades. Pre-made or packaged taco seasonings. Relishes. Regular salad dressings. Other foods Salted popcorn and pretzels. The items listed above may not be a complete list of foods and beverages you should avoid. Contact a dietitian for more information. Where to find more information National Heart, Lung, and Blood Institute: https://wilson-eaton.com/ American Heart Association: www.heart.org Academy of Nutrition and Dietetics: www.eatright.[place]: www.kidney.org Summary The DASH eating plan is a healthy eating plan that has been shown to reduce high blood pressure (hypertension). It may also reduce your risk for type 2 diabetes, heart disease, and stroke. When on the DASH  eating plan, aim to eat more fresh fruits and vegetables, whole grains, lean proteins, low-fat dairy, and heart-healthy fats. With the DASH eating plan, you should limit salt (sodium) intake to 2,300 mg a day. If you have hypertension, you may need to reduce your sodium intake to 1,500 mg a day. Work with your health care provider or dietitian to adjust your eating plan to your individual calorie needs. This information is not intended to replace advice given to you by your health care provider. Make sure you discuss any questions you have with your health care provider. Document Revised: 07/29/2019 Document Reviewed: 07/29/2019 Elsevier Patient Education  [place].   Amlodipine Tablets What is this medication? AMLODIPINE (am LOE di peen) treats high blood pressure and prevents chest pain (angina). It works by relaxing the blood vessels, which helps decrease the amount of work your heart has to do. It belongs to a group of medications called calcium channel blockers. This medicine may be used for other purposes; ask your health care provider  or pharmacist if you have questions. COMMON BRAND NAME(S): Norvasc What should I tell my care team before I take this medication? They need to know if you have any of these conditions: Heart disease Liver disease An unusual or allergic reaction to amlodipine, other medications, foods, dyes, or preservatives Pregnant or trying to get pregnant Breastfeeding How should I use this medication? Take this medication by mouth. Take it as directed on the prescription label at the same time every day. You can take it with or without food. If it upsets your stomach, take it with food. Keep taking it unless your care team tells you to stop. Talk to your care team about the use of this medication in children. While it may be prescribed for children as young as 6 for selected conditions, precautions do apply. Overdosage: If you think you have taken too much of  this medicine contact a poison control center or emergency room at once. NOTE: This medicine is only for you. Do not share this medicine with others. What if I miss a dose? If you miss a dose, take it as soon as you can. If it is almost time for your next dose, take only that dose. Do not take double or extra doses. What may interact with this medication? Clarithromycin Cyclosporine Diltiazem Itraconazole Simvastatin Tacrolimus This list may not describe all possible interactions. Give your health care provider a list of all the medicines, herbs, non-prescription drugs, or dietary supplements you use. Also tell them if you smoke, drink alcohol, or use illegal drugs. Some items may interact with your medicine. What should I watch for while using this medication? Visit your care team for regular checks on your progress. Check your blood pressure as directed. Know what your blood pressure should be and when to contact your care team. Do not treat yourself for coughs, colds, or pain while you are using this medication without asking your care team for advice. Some medications may increase your blood pressure. This medication may affect your coordination, reaction time, or judgment. Do not drive or operate machinery until you know how this medication affects you. Sit up or stand slowly to reduce the risk of dizzy or fainting spells. Drinking alcohol with this medication can increase the risk of these side effects. What side effects may I notice from receiving this medication? Side effects that you should report to your care team as soon as possible: Allergic reactions--skin rash, itching, hives, swelling of the face, lips, tongue, or throat Heart attack--pain or tightness in the chest, shoulders, arms, or jaw, nausea, shortness of breath, cold or clammy skin, feeling faint or lightheaded Low blood pressure--dizziness, feeling faint or lightheaded, blurry vision Worsening chest pain (angina)--pain,  pressure, or tightness in the chest, neck, back, or arms Side effects that usually do not require medical attention (report these to your care team if they continue or are bothersome): Facial flushing, redness Heart palpitations--rapid, pounding, or irregular heartbeat Nausea Stomach pain Swelling of the ankles, hands, or feet This list may not describe all possible side effects. Call your doctor for medical advice about side effects. You may report side effects to FDA at 1-800-FDA-1088. Where should I keep my medication? Keep out of the reach of children and pets. Store at room temperature between 20 and 25 degrees C (68 and 77 degrees F). Protect from light and moisture. Keep the container tightly closed. Get rid of any unused medication after the expiration date. To get rid of medications that are  no longer needed or have expired: Take the medication to a medication take-back program. Check with your pharmacy or law enforcement to find a location. If you cannot return the medication, check the label or package insert to see if the medication should be thrown out in the garbage or flushed down the toilet. If you are not sure, ask your care team. If it is safe to put in the trash, empty the medication out of the container. Mix the medication with cat litter, dirt, coffee grounds, or other unwanted substance. Seal the mixture in a bag or container. Put it in the trash. NOTE: This sheet is a summary. It may not cover all possible information. If you have questions about this medicine, talk to your doctor, pharmacist, or health care provider.  2023 Elsevier/Gold Standard (2022-03-17 00:00:00)    If you have been instructed to have an in-person evaluation today at a local Urgent Care facility, please use the link below. It will take you to a list of all of our available [place] [facility], including address, phone number and hours of operation. Please do not delay care.  [facility]  If you or a family member do not have a primary care provider, use the link below to schedule a visit and establish care. When you choose a [facility] primary care physician or advanced practice provider, you gain a long-term partner in health. Find a Primary Care Provider  Learn more about [facility]'s in-office and virtual care options: [facility]

## 2022-12-05 NOTE — Progress Notes (Signed)
Virtual Visit Consent   Richard Donovan, you are scheduled for a virtual visit with a [facility] provider today. Just as with appointments in the office, your consent must be obtained to participate. Your consent will be active for this visit and any virtual visit you may have with one of our providers in the next 365 days. If you have a MyChart account, a copy of this consent can be sent to you electronically.  As this is a virtual visit, video technology does not allow for your provider to perform a traditional examination. This may limit your provider's ability to fully assess your condition. If your provider identifies any concerns that need to be evaluated in person or the need to arrange testing (such as labs, EKG, etc.), we will make arrangements to do so. Although advances in technology are sophisticated, we cannot ensure that it will always work on either your end or our end. If the connection with a video visit is poor, the visit may have to be switched to a telephone visit. With either a video or telephone visit, we are not always able to ensure that we have a secure connection.  By engaging in this virtual visit, you consent to the provision of healthcare and authorize for your insurance to be billed (if applicable) for the services provided during this visit. Depending on your insurance coverage, you may receive a charge related to this service.  I need to obtain your verbal consent now. Are you willing to proceed with your visit today? Richard Donovan has provided verbal consent on 12/05/2022 for a virtual visit (video or telephone). Mar Daring, PA-C  Date: 12/05/2022 8:03 AM  Virtual Visit via Video Note   I, Mar Daring, connected with  Richard Donovan  ([id number], 1986-05-12) on 12/05/22 at  7:45 AM EDT by a video-enabled telemedicine application and verified that I am speaking with the correct person using two identifiers.  Location: Patient: Virtual Visit Location Patient:  Home Provider: Virtual Visit Location Provider: Home Office   I discussed the limitations of evaluation and management by telemedicine and the availability of in person appointments. The patient expressed understanding and agreed to proceed.    History of Present Illness: Richard Donovan is a 37 y.o. who identifies as a male who was assigned male at birth, and is being seen today for high blood pressure.  HPI: Hypertension This is a new problem. The current episode started yesterday. The problem is unchanged. The problem is uncontrolled. Associated symptoms include anxiety and headaches (more with head stuffiness). Pertinent negatives include no blurred vision, chest pain, malaise/fatigue, neck pain, orthopnea, palpitations, peripheral edema, PND, shortness of breath or sweats. Risk factors for coronary artery disease include family history.    150/110 last night; not checked this morning  Problems:  Patient Active Problem List   Diagnosis Date Noted   Annual physical exam 12/10/2021   Chronic pain of both shoulders 11/04/2021   Sleep apnea    Biceps tendinitis 10/24/2021   Anxiety and depression 10/04/2013   Environmental and seasonal allergies 10/04/1998    Allergies:  Allergies  Allergen Reactions   Sudafed [Pseudoephedrine] Palpitations   Medications:  Current Outpatient Medications:    amLODipine (NORVASC) 5 MG tablet, Take 1 tablet (5 mg total) by mouth daily., Disp: 30 tablet, Rfl: 0   busPIRone (BUSPAR) 5 MG tablet, Take 2 tablets (10 mg total) by mouth 2 (two) times daily. Take 1 tablet (5 mg) by mouth twice daily. After 1  week can increase by 1 tablet (5 mg) weekly. Do not exceed 60 mg / day., Disp: 360 tablet, Rfl: 1   hydrOXYzine (VISTARIL) 25 MG capsule, Take 1-2 capsules (25-50 mg total) by mouth every 8 (eight) hours as needed for anxiety. (Patient not taking: Reported on 08/07/2022), Disp: 30 capsule, Rfl: 0   propranolol (INDERAL) 10 MG tablet, Take 2 tablets (20 mg  total) by mouth 2 (two) times daily as needed., Disp: 30 tablet, Rfl: 0   sertraline (ZOLOFT) 50 MG tablet, Take 1 tablet (50 mg total) by mouth daily., Disp: 90 tablet, Rfl: 0  Observations/Objective: Patient is well-developed, well-nourished in no acute distress.  Resting comfortably at home.  Head is normocephalic, atraumatic.  No labored breathing.  Speech is clear and coherent with logical content.  Patient is alert and oriented at baseline.    Assessment and Plan: 1. Primary hypertension - amLODipine (NORVASC) 5 MG tablet; Take 1 tablet (5 mg total) by mouth daily.  Dispense: 30 tablet; Refill: 0  2. Anxiety, generalized - propranolol (INDERAL) 10 MG tablet; Take 2 tablets (20 mg total) by mouth 2 (two) times daily as needed.  Dispense: 30 tablet; Refill: 0  - Amlodipine started - Propranolol refilled - Monitor BP and keep log for PCP - Low salt diet, DASH diet discussed - Keep scheduled follow up with PCP on 12/11/22  Follow Up Instructions: I discussed the assessment and treatment plan with the patient. The patient was provided an opportunity to ask questions and all were answered. The patient agreed with the plan and demonstrated an understanding of the instructions.  A copy of instructions were sent to the patient via MyChart unless otherwise noted below.    The patient was advised to call back or seek an in-person evaluation if the symptoms worsen or if the condition fails to improve as anticipated.  Time:  I spent 20 minutes with the patient via telehealth technology discussing the above problems/concerns.    Mar Daring, PA-C

## 2022-12-12 ENCOUNTER — Encounter: Payer: Self-pay | Admitting: Family Medicine

## 2022-12-12 ENCOUNTER — Ambulatory Visit (INDEPENDENT_AMBULATORY_CARE_PROVIDER_SITE_OTHER): Payer: 59 | Admitting: Family Medicine

## 2022-12-12 VITALS — BP 138/110 | HR 80 | Ht 73.0 in | Wt 256.0 lb

## 2022-12-12 DIAGNOSIS — I10 Essential (primary) hypertension: Secondary | ICD-10-CM | POA: Diagnosis not present

## 2022-12-12 DIAGNOSIS — Z1159 Encounter for screening for other viral diseases: Secondary | ICD-10-CM

## 2022-12-12 DIAGNOSIS — G4733 Obstructive sleep apnea (adult) (pediatric): Secondary | ICD-10-CM

## 2022-12-12 DIAGNOSIS — Z114 Encounter for screening for human immunodeficiency virus [HIV]: Secondary | ICD-10-CM | POA: Diagnosis not present

## 2022-12-12 DIAGNOSIS — Z13220 Encounter for screening for lipoid disorders: Secondary | ICD-10-CM

## 2022-12-12 DIAGNOSIS — E559 Vitamin D deficiency, unspecified: Secondary | ICD-10-CM

## 2022-12-12 MED ORDER — LISINOPRIL 10 MG PO TABS: 10.0000 mg | ORAL_TABLET | Freq: Every day | ORAL | 1 refills | Status: DC

## 2022-12-12 MED ORDER — AMLODIPINE BESYLATE 5 MG PO TABS
5.0000 mg | ORAL_TABLET | Freq: Every day | ORAL | 1 refills | Status: DC
Start: 2022-12-12 — End: 2023-01-12

## 2022-12-12 NOTE — Progress Notes (Signed)
Primary Care / Sports Medicine Office Visit  Patient Information:  Patient ID: Richard Donovan, male DOB: 10/24/1985 Age: 37 y.o. MRN: [id number]   Richard Donovan is a pleasant 37 y.o. male presenting with the following:  Chief Complaint  Patient presents with   Hypertension    Vitals:   12/12/22 0936 12/12/22 0940  BP: (!) 130/100 (!) 138/110  Pulse: 80   SpO2: 99%    Vitals:   12/12/22 0936  Weight: 256 lb (116.1 kg)  Height: 6\' 1"  (1.854 m)   Body mass index is 33.78 kg/m.  No results found.   Independent interpretation of notes and tests performed by another provider:   None  Procedures performed:   None  Pertinent History, Exam, Impression, and Recommendations:   Richard Donovan was seen today for hypertension.  Primary hypertension Assessment & Plan: Recent virtual visit for the same, incidentally noted while checking blood pressure, does have comorbid anxiety and relays similar chest pressure sensations.  Denies exertional component.  Examination without JVD, positive S1 and S2 without murmurs, rubs, gallops, regular rate and rhythm noted.  Lung fields are clear to auscultation bilaterally without wheezes, rales, rhonchi.  Plan as follows: - Obtain risk stratification labs - Can continue amlodipine 5 mg and propranolol 10 mg twice daily - Start lisinopril 10 mg - Extensive discussion regarding lifestyle modifications, patient will make changes where able to do so - Referral to ENT/sleep medicine for optimization of CPAP - Return in 1 month for CPE  Orders: -     Lisinopril; Take 1 tablet (10 mg total) by mouth daily.  Dispense: 30 tablet; Refill: 1 -     amLODIPine Besylate; Take 1 tablet (5 mg total) by mouth daily.  Dispense: 30 tablet; Refill: 1 -     Apo A1 + B + Ratio -     CBC -     Comprehensive metabolic panel -     Lipid panel -     TSH  OSA on CPAP Assessment & Plan: Maintains compliance, in the setting of multiple ENT surgeries and concomitant  hypertension.  Plan as follows: - Refer to ENT/sleep medicine for further optimization and titration of settings as clinically guided  Orders: -     Ambulatory referral to ENT  Screening for HIV (human immunodeficiency virus) -     HIV Antibody (routine testing w rflx)  Need for hepatitis C screening test -     Hepatitis C antibody  Screening for lipoid disorders -     Comprehensive metabolic panel -     Lipid panel  Vitamin D deficiency -     VITAMIN D 25 Hydroxy (Vit-D Deficiency, Fractures)     Orders & Medications Meds ordered this encounter  Medications   lisinopril (ZESTRIL) 10 MG tablet    Sig: Take 1 tablet (10 mg total) by mouth daily.    Dispense:  30 tablet    Refill:  1   amLODipine (NORVASC) 5 MG tablet    Sig: Take 1 tablet (5 mg total) by mouth daily.    Dispense:  30 tablet    Refill:  1   Orders Placed This Encounter  Procedures   Apo A1 + B + Ratio   CBC   Comprehensive metabolic panel   Hepatitis C antibody   HIV Antibody (routine testing w rflx)   Lipid panel   TSH   VITAMIN D 25 Hydroxy (Vit-D Deficiency, Fractures)   Ambulatory referral to ENT  Return in about 4 weeks (around 01/09/2023) for CPE 1-2 months.     [name], MD, [facility]   Primary Care Sports Medicine Primary Care and Sports Medicine at [facility]

## 2022-12-12 NOTE — Patient Instructions (Addendum)
-   Continue current medications - Start lisinopril 10 mg - Review information on lifestyle modifications and make changes where able to do so - Referral to ENT/sleep medicine for optimization of CPAP placed - Return in 1 month for physical

## 2022-12-12 NOTE — Assessment & Plan Note (Signed)
Maintains compliance, in the setting of multiple ENT surgeries and concomitant hypertension.  Plan as follows: - Refer to ENT/sleep medicine for further optimization and titration of settings as clinically guided

## 2022-12-12 NOTE — Assessment & Plan Note (Signed)
Recent virtual visit for the same, incidentally noted while checking blood pressure, does have comorbid anxiety and relays similar chest pressure sensations.  Denies exertional component.  Examination without JVD, positive S1 and S2 without murmurs, rubs, gallops, regular rate and rhythm noted.  Lung fields are clear to auscultation bilaterally without wheezes, rales, rhonchi.  Plan as follows: - Obtain risk stratification labs - Can continue amlodipine 5 mg and propranolol 10 mg twice daily - Start lisinopril 10 mg - Extensive discussion regarding lifestyle modifications, patient will make changes where able to do so - Referral to ENT/sleep medicine for optimization of CPAP - Return in 1 month for CPE

## 2022-12-23 ENCOUNTER — Other Ambulatory Visit: Payer: Self-pay | Admitting: Physician Assistant

## 2022-12-23 ENCOUNTER — Other Ambulatory Visit: Payer: Self-pay | Admitting: Family Medicine

## 2022-12-23 DIAGNOSIS — F411 Generalized anxiety disorder: Secondary | ICD-10-CM

## 2022-12-23 MED ORDER — PROPRANOLOL HCL 10 MG PO TABS
20.0000 mg | ORAL_TABLET | Freq: Two times a day (BID) | ORAL | 2 refills | Status: DC | PRN
Start: 2022-12-23 — End: 2023-05-12

## 2022-12-24 NOTE — Telephone Encounter (Signed)
Unable to refill per protocol, Rx refilled 12/23/22 for 90 and 1 refill, duplicate request.  Requested Prescriptions  Pending Prescriptions Disp Refills   propranolol (INDERAL) 10 MG tablet [Pharmacy Med Name: PROPRANOLOL 10 MG TABLET] 360 tablet 1    Sig: TAKE 2 TABLETS (20 MG TOTAL) BY MOUTH 2 (TWO) TIMES DAILY AS NEEDED.     Cardiovascular:  Beta Blockers Failed - 12/23/2022  6:08 PM      Failed - Last BP in normal range    BP Readings from Last 1 Encounters:  12/12/22 (!) 138/110         Passed - Last Heart Rate in normal range    Pulse Readings from Last 1 Encounters:  12/12/22 80         Passed - Valid encounter within last 6 months    Recent Outpatient Visits           1 week ago Primary hypertension   [place] [facility] at [facility], Ocie Bob, MD   4 months ago Anxiety, generalized   [place] Primary Care & Sports Medicine at [facility], Ocie Bob, MD   7 months ago Anxiety, generalized   [facility] Primary Care & [facility] Medicine at [facility], Ocie Bob, MD   9 months ago Anxiety and depression   [facility] Primary Care & Sports Medicine at [facility], Ocie Bob, MD   1 year ago Annual physical exam   [facility] Primary Care & Sports Medicine at [facility], Ocie Bob, MD       Future Appointments             In 2 weeks Ashley Royalty, Ocie Bob, MD [facility] Primary Care & Sports Medicine at [facility], [facility]

## 2023-01-03 ENCOUNTER — Other Ambulatory Visit: Payer: Self-pay | Admitting: Family Medicine

## 2023-01-03 DIAGNOSIS — I10 Essential (primary) hypertension: Secondary | ICD-10-CM

## 2023-01-05 NOTE — Telephone Encounter (Signed)
Requested Prescriptions  Pending Prescriptions Disp Refills   lisinopril (ZESTRIL) 10 MG tablet [Pharmacy Med Name: LISINOPRIL 10 MG TABLET] 90 tablet 0    Sig: TAKE 1 TABLET BY MOUTH EVERY DAY     Cardiovascular:  ACE Inhibitors Failed - 01/03/2023 10:31 AM      Failed - Cr in normal range and within 180 days    Creatinine, Ser  Date Value Ref Range Status  10/15/2021 1.08 0.61 - 1.24 mg/dL Final         Failed - K in normal range and within 180 days    Potassium  Date Value Ref Range Status  10/15/2021 3.9 3.5 - 5.1 mmol/L Final         Failed - Last BP in normal range    BP Readings from Last 1 Encounters:  12/12/22 (!) 138/110         Passed - Patient is not pregnant      Passed - Valid encounter within last 6 months    Recent Outpatient Visits           3 weeks ago Primary hypertension   [place] [facility] & [facility] Medicine at [facility], Ocie Bob, MD   5 months ago Anxiety, generalized   [place] Primary Care & Sports Medicine at [facility], Ocie Bob, MD   8 months ago Anxiety, generalized   [facility] Primary Care & Sports Medicine at [facility], Ocie Bob, MD   9 months ago Anxiety and depression   [facility] Primary Care & Sports Medicine at [facility], Ocie Bob, MD   1 year ago Annual physical exam   [facility] Primary Care & Sports Medicine at [facility], Ocie Bob, MD       Future Appointments             In 1 week Ashley Royalty, Ocie Bob, MD [facility] Primary Care & Sports Medicine at [facility], [facility]

## 2023-01-10 LAB — COMPREHENSIVE METABOLIC PANEL
ALT: 24 IU/L (ref 0–44)
AST: 16 IU/L (ref 0–40)
Albumin/Globulin Ratio: 1.7 (ref 1.2–2.2)
Albumin: 4.4 g/dL (ref 4.1–5.1)
Alkaline Phosphatase: 64 IU/L (ref 44–121)
BUN/Creatinine Ratio: 15 (ref 9–20)
BUN: 14 mg/dL (ref 6–20)
Bilirubin Total: 0.4 mg/dL (ref 0.0–1.2)
CO2: 21 mmol/L (ref 20–29)
Calcium: 9.5 mg/dL (ref 8.7–10.2)
Chloride: 102 mmol/L (ref 96–106)
Creatinine, Ser: 0.92 mg/dL (ref 0.76–1.27)
Globulin, Total: 2.6 g/dL (ref 1.5–4.5)
Glucose: 95 mg/dL (ref 70–99)
Potassium: 4.2 mmol/L (ref 3.5–5.2)
Sodium: 140 mmol/L (ref 134–144)
Total Protein: 7 g/dL (ref 6.0–8.5)
eGFR: 111 mL/min/{1.73_m2} (ref >59)

## 2023-01-10 LAB — LIPID PANEL
Chol/HDL Ratio: 6.8 ratio — ABNORMAL HIGH (ref 0.0–5.0)
Cholesterol, Total: 224 mg/dL — ABNORMAL HIGH (ref 100–199)
HDL: 33 mg/dL — ABNORMAL LOW (ref >39)
LDL Chol Calc (NIH): 159 mg/dL — ABNORMAL HIGH (ref 0–99)
Triglycerides: 171 mg/dL — ABNORMAL HIGH (ref 0–149)
VLDL Cholesterol Cal: 32 mg/dL (ref 5–40)

## 2023-01-10 LAB — CBC
Hematocrit: 42.4 % (ref 37.5–51.0)
Hemoglobin: 14.2 g/dL (ref 13.0–17.7)
MCH: 29.6 pg (ref 26.6–33.0)
MCHC: 33.5 g/dL (ref 31.5–35.7)
MCV: 88 fL (ref 79–97)
Platelets: 358 10*3/uL (ref 150–450)
RBC: 4.8 x10E6/uL (ref 4.14–5.80)
RDW: 13.1 % (ref 11.6–15.4)
WBC: 6.6 10*3/uL (ref 3.4–10.8)

## 2023-01-10 LAB — VITAMIN D 25 HYDROXY (VIT D DEFICIENCY, FRACTURES): Vit D, 25-Hydroxy: 31.1 ng/mL (ref 30.0–100.0)

## 2023-01-10 LAB — HEPATITIS C ANTIBODY: Hep C Virus Ab: NONREACTIVE

## 2023-01-10 LAB — HIV ANTIBODY (ROUTINE TESTING W REFLEX): HIV Screen 4th Generation wRfx: NONREACTIVE

## 2023-01-10 LAB — APO A1 + B + RATIO
Apolipo. B/A-1 Ratio: 1.2 ratio — ABNORMAL HIGH (ref 0.0–0.7)
Apolipoprotein A-1: 110 mg/dL (ref 101–178)
Apolipoprotein B: 127 mg/dL — ABNORMAL HIGH (ref <90)

## 2023-01-10 LAB — TSH: TSH: 0.62 u[IU]/mL (ref 0.450–4.500)

## 2023-01-12 ENCOUNTER — Ambulatory Visit (INDEPENDENT_AMBULATORY_CARE_PROVIDER_SITE_OTHER): Payer: 59 | Admitting: Family Medicine

## 2023-01-12 ENCOUNTER — Encounter: Payer: Self-pay | Admitting: Family Medicine

## 2023-01-12 VITALS — BP 136/86 | HR 78 | Ht 73.0 in | Wt 249.0 lb

## 2023-01-12 DIAGNOSIS — I10 Essential (primary) hypertension: Secondary | ICD-10-CM

## 2023-01-12 DIAGNOSIS — E782 Mixed hyperlipidemia: Secondary | ICD-10-CM | POA: Insufficient documentation

## 2023-01-12 DIAGNOSIS — F419 Anxiety disorder, unspecified: Secondary | ICD-10-CM

## 2023-01-12 DIAGNOSIS — F411 Generalized anxiety disorder: Secondary | ICD-10-CM | POA: Diagnosis not present

## 2023-01-12 DIAGNOSIS — Z0000 Encounter for general adult medical examination without abnormal findings: Secondary | ICD-10-CM | POA: Diagnosis not present

## 2023-01-12 DIAGNOSIS — G4733 Obstructive sleep apnea (adult) (pediatric): Secondary | ICD-10-CM

## 2023-01-12 DIAGNOSIS — F32A Depression, unspecified: Secondary | ICD-10-CM

## 2023-01-12 HISTORY — DX: Mixed hyperlipidemia: E78.2

## 2023-01-12 MED ORDER — BUSPIRONE HCL 5 MG PO TABS
10.0000 mg | ORAL_TABLET | Freq: Two times a day (BID) | ORAL | 1 refills | Status: DC
Start: 2023-01-12 — End: 2023-01-14

## 2023-01-12 MED ORDER — AMLODIPINE BESYLATE 5 MG PO TABS
5.0000 mg | ORAL_TABLET | Freq: Every day | ORAL | 1 refills | Status: DC
Start: 2023-01-12 — End: 2023-01-14

## 2023-01-12 MED ORDER — PITAVASTATIN CALCIUM 2 MG PO TABS
2.0000 mg | ORAL_TABLET | Freq: Every day | ORAL | 0 refills | Status: DC
Start: 2023-01-12 — End: 2023-01-14

## 2023-01-12 NOTE — Assessment & Plan Note (Signed)
Noted on recent labs, in the setting of hypertension, OSA.  He has already been making healthy lifestyle changes, additional plan as follows: - Initiate pitavastatin - Recheck labs in 2 months - Continue healthy lifestyle changes as he has been doing

## 2023-01-12 NOTE — Assessment & Plan Note (Signed)
Continues to utilize CPAP, concern for ENT related barriers to efficacy, has visit with them later this week.

## 2023-01-12 NOTE — Assessment & Plan Note (Signed)
Mood overall controlled though noting issues with sleep initiation.  We reviewed various etiologies, plan as follows: - Titrate sertraline to 100 mg daily - Follow-up in 2 months

## 2023-01-12 NOTE — Assessment & Plan Note (Signed)
Annual examination completed, risk stratification labs ordered, anticipatory guidance provided.  We will follow labs once resulted. 

## 2023-01-12 NOTE — Patient Instructions (Addendum)
-   Obtain fasting labs with orders provided prior to follow-up - Review information provided - Attend eye doctor annually, dentist every 6 months, work towards or maintain 30 minutes of moderate intensity physical activity at least 5 days per week, and consume a balanced diet - Return in 2 months - Contact us for any questions between now and then  Additionally: - Start pitavastatin (cholesterol-lowering medication) - Increase sertraline to 100 mg daily

## 2023-01-12 NOTE — Progress Notes (Signed)
Annual Physical Exam Visit  Patient Information:  Patient ID: Richard Donovan, male DOB: 08-23-1986 Age: 37 y.o. MRN: [id number]   Subjective:   CC: Annual Physical Exam  HPI:  Richard Donovan is here for their annual physical.  I reviewed the past medical history, family history, social history, surgical history, and allergies today and changes were made as necessary.  Please see the problem list section below for additional details.  Past Medical History: Past Medical History:  Diagnosis Date   Allergies    Allergy    Anxiety    Family history of adverse reaction to anesthesia    Mother - slow to wake   Hypertension    Mixed hyperlipidemia 01/12/2023   Sleep apnea    CPAP   Past Surgical History: Past Surgical History:  Procedure Laterality Date   ADENOIDECTOMY     EAR TUBE REMOVAL     ETHMOIDECTOMY Left 06/26/2021   Procedure: ANTERIOR ETHMOIDECTOMY;  Surgeon: Bud Face, MD;  Location: [facility];  Service: ENT;  Laterality: Left;  sleep apnea   FRONTAL SINUS EXPLORATION Left 06/26/2021   Procedure: FRONTAL SINUS EXPLORATION;  Surgeon: Bud Face, MD;  Location: [facility];  Service: ENT;  Laterality: Left;   IMAGE GUIDED SINUS SURGERY N/A 06/26/2021   Procedure: IMAGE GUIDED SINUS SURGERY;  Surgeon: Bud Face, MD;  Location: [facility];  Service: ENT;  Laterality: N/A;  need [employer] disk placed disk on OR CHARGE nurse desk 10-11 kp   MAXILLARY ANTROSTOMY Bilateral 06/26/2021   Procedure: MAXILLARY ANTROSTOMY WITH TISSUE REMOVAL;  Surgeon: Bud Face, MD;  Location: [facility];  Service: ENT;  Laterality: Bilateral;   NASAL SEPTOPLASTY W/ TURBINOPLASTY Bilateral 06/26/2021   Procedure: NASAL SEPTOPLASTY WITH INFERIOR TURBINATE REDUCTION;  Surgeon: Bud Face, MD;  Location: [facility];  Service: ENT;  Laterality: Bilateral;   TYMPANOSTOMY TUBE PLACEMENT Bilateral    VASECTOMY  03/19/2022    WISDOM TOOTH EXTRACTION     Family History: Family History  Problem Relation Age of Onset   Anxiety disorder Mother    Diabetes Mother    Obesity Mother    Obesity Father    Diabetes Father    Tremor Father    Hypertension Father    Anxiety disorder Sister    Lung disease Maternal Grandmother    Diabetes Maternal Grandfather    Dementia Paternal Grandmother    Heart attack Paternal Grandfather    Coronary artery disease Paternal Grandfather    Anxiety disorder Sister    Allergies: Allergies  Allergen Reactions   Sudafed [Pseudoephedrine] Palpitations   Health Maintenance: Health Maintenance  Topic Date Due   COVID-19 Vaccine (1) Never done   DTaP/Tdap/Td (1 - Tdap) Never done   INFLUENZA VACCINE  04/09/2023   Hepatitis C Screening  Completed   HIV Screening  Completed   HPV VACCINES  Aged Out    HM Colonoscopy     This patient has no relevant Health Maintenance data.      Medications: Current Outpatient Medications on File Prior to Visit  Medication Sig Dispense Refill   lisinopril (ZESTRIL) 10 MG tablet TAKE 1 TABLET BY MOUTH EVERY DAY 90 tablet 0   propranolol (INDERAL) 10 MG tablet Take 2 tablets (20 mg total) by mouth 2 (two) times daily as needed. 180 tablet 2   sertraline (ZOLOFT) 50 MG tablet Take 1 tablet (50 mg total) by mouth daily. 90 tablet 0   No current facility-administered medications  on file prior to visit.    Review of Systems: No headache, visual changes, nausea, vomiting, diarrhea, constipation, dizziness, abdominal pain, skin rash, fevers, chills, night sweats, swollen lymph nodes, weight loss, chest pain, body aches, joint swelling, muscle aches, shortness of breath, mood changes, visual or auditory hallucinations reported.  Objective:   Vitals:   01/12/23 1322  BP: 136/86  Pulse: 78  SpO2: 98%   Vitals:   01/12/23 1322  Weight: 249 lb (112.9 kg)  Height: 6\' 1"  (1.854 m)   Body mass index is 32.85 kg/m.  General: Well  Developed, well nourished, and in no acute distress.  Neuro: Alert and oriented x3, extra-ocular muscles intact, sensation grossly intact. Cranial nerves II through XII are grossly intact, motor, sensory, and coordinative functions are intact. HEENT: Normocephalic, atraumatic, pupils equal round reactive to light, neck supple, no masses, no lymphadenopathy, thyroid nonpalpable. Oropharynx, nasopharynx, external ear canals are unremarkable. Skin: Warm and dry, no rashes noted.  Cardiac: Regular rate and rhythm, no murmurs rubs or gallops. No peripheral edema. Pulses symmetric. Respiratory: Clear to auscultation bilaterally. Not using accessory muscles, speaking in full sentences.  Abdominal: Soft, nontender, nondistended, positive bowel sounds, no masses, no organomegaly. Musculoskeletal: Shoulder, elbow, wrist, hip, knee, ankle stable, and with full range of motion.   Impression and Recommendations:   The patient was counselled, risk factors were discussed, and anticipatory guidance given.  Problem List Items Addressed This Visit       Cardiovascular and Mediastinum   Hypertension    Interval improved, continue current regimen.      Relevant Medications   amLODipine (NORVASC) 5 MG tablet   Pitavastatin Calcium 2 MG TABS     Respiratory   OSA on CPAP    Continues to utilize CPAP, concern for ENT related barriers to efficacy, has visit with them later this week.        Other   Anxiety and depression    Mood overall controlled though noting issues with sleep initiation.  We reviewed various etiologies, plan as follows: - Titrate sertraline to 100 mg daily - Follow-up in 2 months      Relevant Medications   busPIRone (BUSPAR) 5 MG tablet   Anxiety, generalized   Relevant Medications   busPIRone (BUSPAR) 5 MG tablet   Healthcare maintenance - Primary    Annual examination completed, risk stratification labs ordered, anticipatory guidance provided.  We will follow labs once  resulted.      Mixed hyperlipidemia    Noted on recent labs, in the setting of hypertension, OSA.  He has already been making healthy lifestyle changes, additional plan as follows: - Initiate pitavastatin - Recheck labs in 2 months - Continue healthy lifestyle changes as he has been doing      Relevant Medications   amLODipine (NORVASC) 5 MG tablet   Pitavastatin Calcium 2 MG TABS   Other Relevant Orders   Comprehensive metabolic panel   Lipid panel     Orders & Medications Medications:  Meds ordered this encounter  Medications   amLODipine (NORVASC) 5 MG tablet    Sig: Take 1 tablet (5 mg total) by mouth daily.    Dispense:  30 tablet    Refill:  1   busPIRone (BUSPAR) 5 MG tablet    Sig: Take 2 tablets (10 mg total) by mouth 2 (two) times daily. Take 1 tablet (5 mg) by mouth twice daily. After 1 week can increase by 1 tablet (5 mg) weekly. Do not  exceed 60 mg / day.    Dispense:  360 tablet    Refill:  1   Pitavastatin Calcium 2 MG TABS    Sig: Take 1 tablet (2 mg total) by mouth daily.    Dispense:  90 tablet    Refill:  0   Orders Placed This Encounter  Procedures   Comprehensive metabolic panel   Lipid panel     Return in about 2 months (around 03/14/2023) for f/u.    [name], MD, [facility]   Primary Care Sports Medicine Primary Care and Sports Medicine at [facility]

## 2023-01-12 NOTE — Assessment & Plan Note (Signed)
>>  ASSESSMENT AND PLAN FOR ANXIETY AND DEPRESSION WRITTEN ON 01/12/2023  2:02 PM BY Jan Olano, Ocie Bob, MD  Mood overall controlled though noting issues with sleep initiation.  We reviewed various etiologies, plan as follows: - Titrate sertraline to 100 mg daily - Follow-up in 2 months

## 2023-01-12 NOTE — Assessment & Plan Note (Signed)
Interval improved, continue current regimen.

## 2023-01-13 ENCOUNTER — Encounter: Payer: Self-pay | Admitting: Family Medicine

## 2023-01-14 ENCOUNTER — Other Ambulatory Visit: Payer: Self-pay

## 2023-01-14 DIAGNOSIS — F411 Generalized anxiety disorder: Secondary | ICD-10-CM

## 2023-01-14 DIAGNOSIS — I10 Essential (primary) hypertension: Secondary | ICD-10-CM

## 2023-01-14 DIAGNOSIS — E782 Mixed hyperlipidemia: Secondary | ICD-10-CM

## 2023-01-14 MED ORDER — AMLODIPINE BESYLATE 5 MG PO TABS
5.0000 mg | ORAL_TABLET | Freq: Every day | ORAL | 1 refills | Status: DC
Start: 2023-01-14 — End: 2023-01-15

## 2023-01-14 MED ORDER — PITAVASTATIN CALCIUM 2 MG PO TABS
2.0000 mg | ORAL_TABLET | Freq: Every day | ORAL | 0 refills | Status: DC
Start: 2023-01-14 — End: 2023-01-15

## 2023-01-14 MED ORDER — BUSPIRONE HCL 5 MG PO TABS
10.0000 mg | ORAL_TABLET | Freq: Two times a day (BID) | ORAL | 1 refills | Status: DC
Start: 2023-01-14 — End: 2023-01-15

## 2023-01-15 ENCOUNTER — Other Ambulatory Visit: Payer: Self-pay

## 2023-01-15 DIAGNOSIS — E782 Mixed hyperlipidemia: Secondary | ICD-10-CM

## 2023-01-15 DIAGNOSIS — I10 Essential (primary) hypertension: Secondary | ICD-10-CM

## 2023-01-15 DIAGNOSIS — F411 Generalized anxiety disorder: Secondary | ICD-10-CM

## 2023-01-15 MED ORDER — BUSPIRONE HCL 5 MG PO TABS
10.0000 mg | ORAL_TABLET | Freq: Two times a day (BID) | ORAL | 1 refills | Status: DC
Start: 2023-01-15 — End: 2023-01-20

## 2023-01-15 MED ORDER — PITAVASTATIN CALCIUM 2 MG PO TABS
2.0000 mg | ORAL_TABLET | Freq: Every day | ORAL | 0 refills | Status: DC
Start: 2023-01-15 — End: 2023-04-02

## 2023-01-15 MED ORDER — AMLODIPINE BESYLATE 5 MG PO TABS
5.0000 mg | ORAL_TABLET | Freq: Every day | ORAL | 1 refills | Status: DC
Start: 2023-01-15 — End: 2023-06-17

## 2023-01-20 ENCOUNTER — Ambulatory Visit: Payer: Self-pay | Admitting: *Deleted

## 2023-01-20 ENCOUNTER — Other Ambulatory Visit: Payer: Self-pay | Admitting: Family Medicine

## 2023-01-20 DIAGNOSIS — F411 Generalized anxiety disorder: Secondary | ICD-10-CM

## 2023-01-20 MED ORDER — BUSPIRONE HCL 10 MG PO TABS
10.0000 mg | ORAL_TABLET | Freq: Two times a day (BID) | ORAL | 1 refills | Status: AC
Start: 2023-01-20 — End: 2023-07-19

## 2023-01-20 NOTE — Telephone Encounter (Signed)
  Chief Complaint: [facility] needing dosing clarification for the Buspar 5 mg. Symptoms: N/A Frequency: N/A Pertinent Negatives: Patient denies N/A Disposition: [] ED /[] Urgent Care (no appt availability in office) / [] Appointment(In office/virtual)/ []  [place] [facility]/ [] Home Care/ [] Refused Recommended Disposition /[] [place] [facility]/ [x]  Follow-up with PCP Additional Notes: See notes for dosing clarification and instructions to pt on how to titrate up on the dose.   Will he increase the dose in the morning or evening?  Needing clarification on how many times a day not to exceed 60 mg.  Message sent to Dr. Joseph Berkshire.

## 2023-01-20 NOTE — Telephone Encounter (Signed)
Reason for Disposition  [1] Pharmacy calling with prescription question AND [2] triager unable to answer question  Answer Assessment - Initial Assessment Questions 1. NAME of MEDICINE: "What medicine(s) are you calling about?"     Tresa Endo, Pharmacist [employer] calling in for clarification on  Buspar 5 mg 2. QUESTION: "What is your question?" (e.g., double dose of medicine, side effect)      01/12/2023 Rx    Directions needing clarification  Needing clarification on the 3 times a day dosing and not exceeding 60 mg.  Also need to clarify with pt the titration of this medication.   Will he increase the dose in the morning or evening? Please call back to [phone]   Reference #[id number]. 3. PRESCRIBER: "Who prescribed the medicine?" Reason: if prescribed by specialist, call should be referred to that group.     Dr. Ashley Royalty 4. SYMPTOMS: "Do you have any symptoms?" If Yes, ask: "What symptoms are you having?"  "How bad are the symptoms (e.g., mild, moderate, severe)     N/A 5. PREGNANCY:  "Is there any chance that you are pregnant?" "When was your last menstrual period?"     N/A  Protocols used: Medication Question Call-A-AH

## 2023-01-20 NOTE — Telephone Encounter (Signed)
Please advise 

## 2023-03-19 ENCOUNTER — Ambulatory Visit: Payer: 59 | Admitting: Family Medicine

## 2023-03-25 ENCOUNTER — Encounter: Payer: Self-pay | Admitting: Family Medicine

## 2023-03-25 ENCOUNTER — Ambulatory Visit: Payer: 59 | Admitting: Family Medicine

## 2023-03-25 ENCOUNTER — Other Ambulatory Visit: Payer: Self-pay | Admitting: Family Medicine

## 2023-03-25 VITALS — BP 128/84 | HR 78 | Ht 73.0 in | Wt 247.0 lb

## 2023-03-25 DIAGNOSIS — I10 Essential (primary) hypertension: Secondary | ICD-10-CM

## 2023-03-25 DIAGNOSIS — F411 Generalized anxiety disorder: Secondary | ICD-10-CM

## 2023-03-25 DIAGNOSIS — R6882 Decreased libido: Secondary | ICD-10-CM | POA: Insufficient documentation

## 2023-03-25 MED ORDER — SILDENAFIL CITRATE 100 MG PO TABS
50.0000 mg | ORAL_TABLET | ORAL | 10 refills | Status: DC | PRN
Start: 2023-03-25 — End: 2023-04-20

## 2023-03-25 MED ORDER — AUVELITY 45-105 MG PO TBCR
1.0000 | EXTENDED_RELEASE_TABLET | Freq: Two times a day (BID) | ORAL | 2 refills | Status: DC
Start: 2023-03-25 — End: 2023-07-16

## 2023-03-25 NOTE — Assessment & Plan Note (Signed)
Excellent control demonstrated today.

## 2023-03-25 NOTE — Assessment & Plan Note (Signed)
Most likely secondary to SSRI, see additional assessment(s) for plan details.  - Trial sildenafil

## 2023-03-25 NOTE — Telephone Encounter (Signed)
Requested Prescriptions  Pending Prescriptions Disp Refills   lisinopril (ZESTRIL) 10 MG tablet [Pharmacy Med Name: LISINOPRIL 10 MG TABLET] 90 tablet 0    Sig: TAKE 1 TABLET BY MOUTH EVERY DAY     Cardiovascular:  ACE Inhibitors Passed - 03/25/2023 10:56 AM      Passed - Cr in normal range and within 180 days    Creatinine, Ser  Date Value Ref Range Status  01/09/2023 0.92 0.76 - 1.27 mg/dL Final         Passed - K in normal range and within 180 days    Potassium  Date Value Ref Range Status  01/09/2023 4.2 3.5 - 5.2 mmol/L Final         Passed - Patient is not pregnant      Passed - Last BP in normal range    BP Readings from Last 1 Encounters:  03/25/23 128/84         Passed - Valid encounter within last 6 months    Recent Outpatient Visits           Today Anxiety, generalized   [place] [facility] Medicine at [facility], Ocie Bob, MD   2 months ago Healthcare maintenance   [facility] Primary Care & Sports Medicine at [facility], Ocie Bob, MD   3 months ago Primary hypertension   [place] [facility] & [facility] Medicine at [facility], Ocie Bob, MD   7 months ago Anxiety, generalized   [facility] Primary Care & Sports Medicine at [facility], Ocie Bob, MD   10 months ago Anxiety, generalized   [facility] Primary Care & Sports Medicine at [facility], Ocie Bob, MD       Future Appointments             In 4 weeks Ashley Royalty, Ocie Bob, MD [facility] Primary Care & Sports Medicine at [facility], [facility]   In 9 months Ashley Royalty, Ocie Bob, MD [facility] Primary Care & Sports Medicine at [facility], [facility]

## 2023-03-25 NOTE — Patient Instructions (Signed)
-   Start 1 tablet Auvelity daily - Continue sertraline 50 mg and other medications - Trial sildenafil 50-100 mg 30 minutes prior to sexual activity - Review information attached - Video visit in 1 month

## 2023-03-25 NOTE — Assessment & Plan Note (Signed)
Increase in sertraline with no significant improvements in mood coupled with worsened sexual side effects.   Plan as follows: - Resume / maintain sertraline 50 mg daily dosing - Start Auvelity, 1 tablet daily - Remain on buspirone and propranolol - Video visit in 1 month - Plan to consider titration up on Auvelity to maximum BID with wean from sertraline

## 2023-03-25 NOTE — Progress Notes (Signed)
     Primary Care / Sports Medicine Office Visit  Patient Information:  Patient ID: Richard Donovan, male DOB: [birth year] Age: 37 y.o. MRN: [id number]   Richard Donovan is a pleasant 37 y.o. male presenting with the following:  Chief Complaint  Patient presents with   Anxiety and depression    Vitals:   03/25/23 0937  BP: 128/84  Pulse: 78  SpO2: 98%   Vitals:   03/25/23 0937  Weight: 247 lb (112 kg)  Height: 6\' 1"  (1.854 m)   Body mass index is 32.59 kg/m.  No results found.   Independent interpretation of notes and tests performed by another provider:   None  Procedures performed:   None  Pertinent History, Exam, Impression, and Recommendations:   Richard Donovan was seen today for anxiety and depression.  Anxiety, generalized Assessment & Plan: Increase in sertraline with no significant improvements in mood coupled with worsened sexual side effects.   Plan as follows: - Resume / maintain sertraline 50 mg daily dosing - Start Auvelity, 1 tablet daily - Remain on buspirone and propranolol - Video visit in 1 month - Plan to consider titration up on Auvelity to maximum BID with wean from sertraline  Orders: -     Auvelity; Take 1 tablet by mouth in the morning and at bedtime.  Dispense: 180 tablet; Refill: 2  Primary hypertension Assessment & Plan: Excellent control demonstrated today.   Low libido Assessment & Plan: Most likely secondary to SSRI, see additional assessment(s) for plan details.  - Trial sildenafil  Orders: -     Sildenafil Citrate; Take 0.5-1 tablets (50-100 mg total) by mouth as needed for erectile dysfunction (for use prior to sexual activity).  Dispense: 30 tablet; Refill: 10     Orders & Medications Meds ordered this encounter  Medications   sildenafil (VIAGRA) 100 MG tablet    Sig: Take 0.5-1 tablets (50-100 mg total) by mouth as needed for erectile dysfunction (for use prior to sexual activity).    Dispense:  30 tablet    Refill:  10    Dextromethorphan-buPROPion ER (AUVELITY) 45-105 MG TBCR    Sig: Take 1 tablet by mouth in the morning and at bedtime.    Dispense:  180 tablet    Refill:  2   No orders of the defined types were placed in this encounter.    Return in about 4 weeks (around 04/22/2023) for Virtual Visit.     [name], MD, [facility]   Primary Care Sports Medicine Primary Care and Sports Medicine at [facility]

## 2023-03-31 ENCOUNTER — Other Ambulatory Visit: Payer: Self-pay | Admitting: Family Medicine

## 2023-03-31 DIAGNOSIS — E782 Mixed hyperlipidemia: Secondary | ICD-10-CM

## 2023-04-02 NOTE — Telephone Encounter (Signed)
Requested Prescriptions  Pending Prescriptions Disp Refills   Pitavastatin Calcium 2 MG TABS [Pharmacy Med Name: PITAVASTATIN TABS 2MG ] 90 tablet 1    Sig: TAKE 1 TABLET DAILY     Cardiovascular:  Antilipid - Statins 2 Failed - 03/31/2023  4:23 PM      Failed - Lipid Panel in normal range within the last 12 months    Cholesterol, Total  Date Value Ref Range Status  01/09/2023 224 (H) 100 - 199 mg/dL Final   LDL Chol Calc (NIH)  Date Value Ref Range Status  01/09/2023 159 (H) 0 - 99 mg/dL Final   HDL  Date Value Ref Range Status  01/09/2023 33 (L) >39 mg/dL Final   Triglycerides  Date Value Ref Range Status  01/09/2023 171 (H) 0 - 149 mg/dL Final         Passed - Cr in normal range and within 360 days    Creatinine, Ser  Date Value Ref Range Status  01/09/2023 0.92 0.76 - 1.27 mg/dL Final         Passed - Patient is not pregnant      Passed - Valid encounter within last 12 months    Recent Outpatient Visits           1 week ago Anxiety, generalized   [place] [facility], Ocie Bob, MD   2 months ago Healthcare maintenance   [facility] Primary Care & [facility] Medicine at [facility], Ocie Bob, MD   3 months ago Primary hypertension   [place] [facility] at [facility], Ocie Bob, MD   7 months ago Anxiety, generalized   [facility] Sports Medicine at [facility], Ocie Bob, MD   11 months ago Anxiety, generalized   [facility] Primary Care & Sports Medicine at [facility], Ocie Bob, MD       Future Appointments             In 2 weeks Ashley Royalty, Ocie Bob, MD [facility] Primary Care & Sports Medicine at [facility], [facility]   In 9 months Ashley Royalty, Ocie Bob, MD [facility] Sports Medicine at [facility], [facility]

## 2023-04-09 ENCOUNTER — Encounter: Payer: Self-pay | Admitting: Family Medicine

## 2023-04-09 NOTE — Telephone Encounter (Signed)
Please review. Appt?  KP 

## 2023-04-19 ENCOUNTER — Other Ambulatory Visit: Payer: Self-pay | Admitting: Family Medicine

## 2023-04-19 DIAGNOSIS — F419 Anxiety disorder, unspecified: Secondary | ICD-10-CM

## 2023-04-20 ENCOUNTER — Other Ambulatory Visit: Payer: Self-pay

## 2023-04-20 DIAGNOSIS — R6882 Decreased libido: Secondary | ICD-10-CM

## 2023-04-20 MED ORDER — SILDENAFIL CITRATE 100 MG PO TABS
50.0000 mg | ORAL_TABLET | ORAL | 1 refills | Status: DC | PRN
Start: 2023-04-20 — End: 2024-07-30

## 2023-04-20 NOTE — Progress Notes (Signed)
Sent mail order sildenafil

## 2023-04-21 NOTE — Telephone Encounter (Signed)
Requested Prescriptions  Pending Prescriptions Disp Refills   sertraline (ZOLOFT) 50 MG tablet [Pharmacy Med Name: SERTRALINE HCL 50 MG TABLET] 90 tablet 0    Sig: TAKE 1 TABLET BY MOUTH EVERY DAY     Psychiatry:  Antidepressants - SSRI - sertraline Passed - 04/19/2023  8:47 AM      Passed - AST in normal range and within 360 days    AST  Date Value Ref Range Status  01/09/2023 16 0 - 40 IU/L Final         Passed - ALT in normal range and within 360 days    ALT  Date Value Ref Range Status  01/09/2023 24 0 - 44 IU/L Final         Passed - Completed PHQ-2 or PHQ-9 in the last 360 days      Passed - Valid encounter within last 6 months    Recent Outpatient Visits           3 weeks ago Anxiety, generalized   [place] [facility] at [facility], Ocie Bob, MD   3 months ago Healthcare maintenance   [facility] Primary Care & Sports Medicine at [facility], Ocie Bob, MD   4 months ago Primary hypertension   [place] [facility] at [facility], Ocie Bob, MD   8 months ago Anxiety, generalized   [facility] at [facility], Ocie Bob, MD   11 months ago Anxiety, generalized   [facility] Primary [facility] & Sports Medicine at [facility], Ocie Bob, MD       Future Appointments             In 3 weeks Ashley Royalty, Ocie Bob, MD [facility] Primary Care & Sports Medicine at [facility], [facility]   In 8 months Ashley Royalty, Ocie Bob, MD [facility] Health Primary Care & Sports Medicine at [facility], [facility]

## 2023-04-22 ENCOUNTER — Telehealth: Payer: 59 | Admitting: Family Medicine

## 2023-04-28 ENCOUNTER — Encounter: Payer: Self-pay | Admitting: Family Medicine

## 2023-05-12 ENCOUNTER — Encounter: Payer: Self-pay | Admitting: Family Medicine

## 2023-05-12 ENCOUNTER — Telehealth (INDEPENDENT_AMBULATORY_CARE_PROVIDER_SITE_OTHER): Payer: 59 | Admitting: Family Medicine

## 2023-05-12 DIAGNOSIS — F411 Generalized anxiety disorder: Secondary | ICD-10-CM | POA: Diagnosis not present

## 2023-05-12 DIAGNOSIS — J3089 Other allergic rhinitis: Secondary | ICD-10-CM | POA: Diagnosis not present

## 2023-05-12 DIAGNOSIS — I10 Essential (primary) hypertension: Secondary | ICD-10-CM

## 2023-05-12 DIAGNOSIS — F419 Anxiety disorder, unspecified: Secondary | ICD-10-CM

## 2023-05-12 MED ORDER — LISINOPRIL 10 MG PO TABS
10.0000 mg | ORAL_TABLET | Freq: Every day | ORAL | 0 refills | Status: DC
Start: 2023-05-12 — End: 2023-08-10

## 2023-05-12 MED ORDER — PROPRANOLOL HCL 10 MG PO TABS
20.0000 mg | ORAL_TABLET | Freq: Two times a day (BID) | ORAL | 2 refills | Status: DC | PRN
Start: 2023-05-12 — End: 2023-09-30

## 2023-05-12 NOTE — Assessment & Plan Note (Signed)
For nighttime cough: - Trial Flonase, 2 sprays in each nostril nightly and Claritin/Zyrtec, 1 tablet nightly x 1 week - If effective, can use this combination seasonally - If ineffective, trial Nexium/Prilosec, 1 capsule 30 minutes prior to dinner x 1 week - You may need a combination of the above medications - Contact us to provide a status update after 2-4 weeks

## 2023-05-12 NOTE — Assessment & Plan Note (Signed)
Tolerating Auvelity well. Still with sexual AE.  - Decrease sertraline to 25 mg daily x 1 week then discontinue - After 1 week increase Auvelity from 1 tablet daily to 1 tablet twice a day - Contact us to provide a status update in 4 weeks

## 2023-05-12 NOTE — Patient Instructions (Signed)
For nighttime cough: - Trial Flonase, 2 sprays in each nostril nightly and Claritin/Zyrtec, 1 tablet nightly x 1 week - If effective, can use this combination seasonally - If ineffective, trial Nexium/Prilosec, 1 capsule 30 minutes prior to dinner x 1 week - You may need a combination of the above medications - Contact us to provide a status update after 2-4 weeks  For behavioral health: - Decrease sertraline to 25 mg daily x 1 week then discontinue - After 1 week increase Auvelity from 1 tablet daily to 1 tablet twice a day - Contact us to provide a status update in 4 weeks

## 2023-05-12 NOTE — Progress Notes (Signed)
Primary Care / Sports Medicine Virtual Visit  Patient Information:  Patient ID: Penelope Raza, male DOB: Jul 24, 1986 Age: 37 y.o. MRN: [id number]   Rayniel Luney is a pleasant 37 y.o. male presenting with the following:  Chief Complaint  Patient presents with   Anxiety, generalized    Review of Systems: No fevers, chills, night sweats, weight loss, chest pain, or shortness of breath.   Patient Active Problem List   Diagnosis Date Noted   Low libido 03/25/2023   Healthcare maintenance 01/12/2023   Mixed hyperlipidemia 01/12/2023   OSA on CPAP 12/12/2022   Hypertension 12/12/2022   Annual physical exam 12/10/2021   Chronic pain of both shoulders 11/04/2021   Biceps tendinitis 10/24/2021   Anxiety, generalized 10/04/2013   Environmental and seasonal allergies 10/04/1998   Past Medical History:  Diagnosis Date   Allergies    Allergy    Anxiety    Family history of adverse reaction to anesthesia    Mother - slow to wake   Hypertension    Mixed hyperlipidemia 01/12/2023   Sleep apnea    CPAP   Outpatient Encounter Medications as of 05/12/2023  Medication Sig   amLODipine (NORVASC) 5 MG tablet Take 1 tablet (5 mg total) by mouth daily.   busPIRone (BUSPAR) 10 MG tablet Take 1 tablet (10 mg total) by mouth 2 (two) times daily.   Dextromethorphan-buPROPion ER (AUVELITY) 45-105 MG TBCR Take 1 tablet by mouth in the morning and at bedtime.   Pitavastatin Calcium 2 MG TABS TAKE 1 TABLET DAILY   sertraline (ZOLOFT) 50 MG tablet TAKE 1 TABLET BY MOUTH EVERY DAY   sildenafil (VIAGRA) 100 MG tablet Take 0.5-1 tablets (50-100 mg total) by mouth as needed for erectile dysfunction (for use prior to sexual activity).   [DISCONTINUED] lisinopril (ZESTRIL) 10 MG tablet TAKE 1 TABLET BY MOUTH EVERY DAY   [DISCONTINUED] propranolol (INDERAL) 10 MG tablet Take 2 tablets (20 mg total) by mouth 2 (two) times daily as needed.   lisinopril (ZESTRIL) 10 MG tablet Take 1 tablet (10 mg total) by mouth  daily.   propranolol (INDERAL) 10 MG tablet Take 2 tablets (20 mg total) by mouth 2 (two) times daily as needed.   No facility-administered encounter medications on file as of 05/12/2023.   Past Surgical History:  Procedure Laterality Date   ADENOIDECTOMY     EAR TUBE REMOVAL     ETHMOIDECTOMY Left 06/26/2021   Procedure: ANTERIOR ETHMOIDECTOMY;  Surgeon: Bud Face, MD;  [facility];  Service: ENT;  Laterality: Left;  sleep apnea   FRONTAL SINUS EXPLORATION Left 06/26/2021   Procedure: FRONTAL SINUS EXPLORATION;  Surgeon: Bud Face, MD;  Location: [facility];  Service: ENT;  Laterality: Left;   IMAGE GUIDED SINUS SURGERY N/A 06/26/2021   Procedure: IMAGE GUIDED SINUS SURGERY;  Surgeon: Bud Face, MD;  Location: [facility];  Service: ENT;  Laterality: N/A;  need stryker disk placed disk on OR CHARGE nurse desk 10-11 kp   MAXILLARY ANTROSTOMY Bilateral 06/26/2021   Procedure: MAXILLARY ANTROSTOMY WITH TISSUE REMOVAL;  Surgeon: Bud Face, MD;  Location: [facility];  Service: ENT;  Laterality: Bilateral;   NASAL SEPTOPLASTY W/ TURBINOPLASTY Bilateral 06/26/2021   Procedure: NASAL SEPTOPLASTY WITH INFERIOR TURBINATE REDUCTION;  Surgeon: Bud Face, MD;  Location: [facility];  Service: ENT;  Laterality: Bilateral;   TYMPANOSTOMY TUBE PLACEMENT Bilateral    VASECTOMY  03/19/2022   WISDOM TOOTH EXTRACTION  Virtual Visit via MyChart Video:   I connected with Ladarion Charon on 05/12/23 via MyChart Video and verified that I am speaking with the correct person using appropriate identifiers.   The limitations, risks, security and privacy concerns of performing an evaluation and management service by MyChart Video, including the higher likelihood of inaccurate diagnoses and treatments, and the availability of in person appointments were reviewed. The possible need of an additional face-to-face encounter for  complete and high quality delivery of care was discussed. The patient was also made aware that there may be a patient responsible charge related to this service. The patient expressed understanding and wishes to proceed.  Provider location is in medical facility. Patient location is at their home, different from provider location. People involved in care of the patient during this telehealth encounter were myself, my nurse/medical assistant, and my front office/scheduling team member.  Objective findings:   General: Speaking full sentences, no audible heavy breathing. Sounds alert and appropriately interactive. Well-appearing. Face symmetric. Extraocular movements intact. Pupils equal and round. No nasal flaring or accessory muscle use visualized.  Independent interpretation of notes and tests performed by another provider:   None  Pertinent History, Exam, Impression, and Recommendations:   Anxiety, generalized Tolerating Auvelity well. Still with sexual AE.  - Decrease sertraline to 25 mg daily x 1 week then discontinue - After 1 week increase Auvelity from 1 tablet daily to 1 tablet twice a day - Contact us to provide a status update in 4 weeks  Environmental and seasonal allergies For nighttime cough: - Trial Flonase, 2 sprays in each nostril nightly and Claritin/Zyrtec, 1 tablet nightly x 1 week - If effective, can use this combination seasonally - If ineffective, trial Nexium/Prilosec, 1 capsule 30 minutes prior to dinner x 1 week - You may need a combination of the above medications - Contact us to provide a status update after 2-4 weeks  Orders & Medications Meds ordered this encounter  Medications   propranolol (INDERAL) 10 MG tablet    Sig: Take 2 tablets (20 mg total) by mouth 2 (two) times daily as needed.    Dispense:  180 tablet    Refill:  2   lisinopril (ZESTRIL) 10 MG tablet    Sig: Take 1 tablet (10 mg total) by mouth daily.    Dispense:  90 tablet    Refill:   0   No orders of the defined types were placed in this encounter.    I discussed the above assessment and treatment plan with the patient. The patient was provided an opportunity to ask questions and all were answered. The patient agreed with the plan and demonstrated an understanding of the instructions.   The patient was advised to call back or seek an in-person evaluation if the symptoms worsen or if the condition fails to improve as anticipated.   I provided a total time of 40 minutes including both face-to-face and non-face-to-face time on 05/12/2023 inclusive of time utilized for medical chart review, information gathering, care coordination with staff, and documentation completion.    [name], MD, [facility]   Primary Care Sports Medicine Primary Care and Sports Medicine at [facility]

## 2023-05-22 ENCOUNTER — Encounter: Payer: Self-pay | Admitting: Family Medicine

## 2023-05-28 ENCOUNTER — Telehealth: Payer: 59 | Admitting: Physician Assistant

## 2023-05-28 DIAGNOSIS — J069 Acute upper respiratory infection, unspecified: Secondary | ICD-10-CM

## 2023-05-28 MED ORDER — BENZONATATE 100 MG PO CAPS
100.0000 mg | ORAL_CAPSULE | Freq: Three times a day (TID) | ORAL | 0 refills | Status: DC | PRN
Start: 2023-05-28 — End: 2023-08-11

## 2023-05-28 MED ORDER — FLUTICASONE PROPIONATE 50 MCG/ACT NA SUSP
2.0000 | Freq: Every day | NASAL | 0 refills | Status: DC
Start: 2023-05-28 — End: 2023-08-11

## 2023-05-28 NOTE — Progress Notes (Signed)
I have spent 5 minutes in review of e-visit questionnaire, review and updating patient chart, medical decision making and response to patient.   Mia Milan Cody Jacklynn Dehaas, PA-C    

## 2023-05-28 NOTE — Progress Notes (Signed)

## 2023-06-04 ENCOUNTER — Telehealth: Payer: 59 | Admitting: Physician Assistant

## 2023-06-04 DIAGNOSIS — B9689 Other specified bacterial agents as the cause of diseases classified elsewhere: Secondary | ICD-10-CM

## 2023-06-04 MED ORDER — AMOXICILLIN-POT CLAVULANATE 875-125 MG PO TABS
1.0000 | ORAL_TABLET | Freq: Two times a day (BID) | ORAL | 0 refills | Status: DC
Start: 2023-06-04 — End: 2023-08-27

## 2023-06-04 NOTE — Progress Notes (Signed)
   We are sorry that you are not feeling well.  Here is how we plan to help!  Based on what you have shared with me it looks like you have a bacterial URI.  This is an infection caused by bacteria and is treated with antibiotics. I have prescribed Augmentin 875mg /125mg  one tablet twice daily with food, for 7 days. You may use an oral decongestant such as Mucinex D or if you have glaucoma or high blood pressure use plain Mucinex. Saline nasal spray help and can safely be used as often as needed for congestion.  If you develop worsening sinus pain, fever or notice severe headache and vision changes, or if symptoms are not better after completion of antibiotic, please schedule an appointment with a health care provider.    Sinus infections are not as easily transmitted as other respiratory infection, however we still recommend that you avoid close contact with loved ones, especially the very young and elderly.  Remember to wash your hands thoroughly throughout the day as this is the number one way to prevent the spread of infection!  Home Care: Only take medications as instructed by your medical team. Complete the entire course of an antibiotic. Do not take these medications with alcohol. A steam or ultrasonic humidifier can help congestion.  You can place a towel over your head and breathe in the steam from hot water coming from a faucet. Avoid close contacts especially the very young and the elderly. Cover your mouth when you cough or sneeze. Always remember to wash your hands.  Get Help Right Away If: You develop worsening fever or sinus pain. You develop a severe head ache or visual changes. Your symptoms persist after you have completed your treatment plan.  Make sure you Understand these instructions. Will watch your condition. Will get help right away if you are not doing well or get worse.  Thank you for choosing an e-visit.  Your e-visit answers were reviewed by a board certified  advanced clinical practitioner to complete your personal care plan. Depending upon the condition, your plan could have included both over the counter or prescription medications.  Please review your pharmacy choice. Make sure the pharmacy is open so you can pick up prescription now. If there is a problem, you may contact your provider through [employer] and have the prescription routed to another pharmacy.  Your safety is important to [place]. If you have drug allergies check your prescription carefully.   For the next 24 hours you can use MyChart to ask questions about today's visit, request a non-urgent call back, or ask for a work or school excuse. You will get an email in the next two days asking about your experience. I hope that your e-visit has been valuable and will speed your recovery.

## 2023-06-04 NOTE — Progress Notes (Signed)
I have spent 5 minutes in review of e-visit questionnaire, review and updating patient chart, medical decision making and response to patient.   Mia Milan Cody Jacklynn Dehaas, PA-C    

## 2023-06-13 ENCOUNTER — Encounter: Payer: Self-pay | Admitting: Family Medicine

## 2023-06-15 NOTE — Telephone Encounter (Signed)
Please schedule pt an appt.  KP

## 2023-06-15 NOTE — Telephone Encounter (Signed)
Please review.  KP

## 2023-06-17 ENCOUNTER — Other Ambulatory Visit: Payer: Self-pay | Admitting: Family Medicine

## 2023-06-17 DIAGNOSIS — I10 Essential (primary) hypertension: Secondary | ICD-10-CM

## 2023-06-17 DIAGNOSIS — F411 Generalized anxiety disorder: Secondary | ICD-10-CM

## 2023-06-17 NOTE — Telephone Encounter (Signed)
Requested medication (s) are due for refill today: For review  Requested medication (s) are on the active medication list: yes    Last refill: 01/15/23  #360  1 refill  Future visit scheduled yes 01/15/24  Notes to clinic:Titrated dosing, please review. Thank you.  Requested Prescriptions  Pending Prescriptions Disp Refills   busPIRone (BUSPAR) 5 MG tablet [Pharmacy Med Name: BUSPIRONE HCL 5 MG TABLET] 360 tablet 1    Sig: TAKE 2 TABLETS (10 MG TOTAL) BY MOUTH 2 (TWO) TIMES DAILY. TAKE 1 TABLET (5 MG) BY MOUTH TWICE DAILY. AFTER 1 WEEK CAN INCREASE BY 1 TABLET (5 MG) WEEKLY. DO NOT EXCEED 60 MG / DAY.     Psychiatry: Anxiolytics/Hypnotics - Non-controlled Passed - 06/17/2023  1:14 AM      Passed - Valid encounter within last 12 months    Recent Outpatient Visits           1 month ago Environmental and seasonal allergies   [place] [facility], Ocie Bob, MD   2 months ago Anxiety, generalized   [place] [facility] at [facility], Ocie Bob, MD   5 months ago Healthcare maintenance   [facility] Primary Care & Sports Medicine at [facility], Ocie Bob, MD   6 months ago Primary hypertension   [place] [facility], Ocie Bob, MD   10 months ago Anxiety, generalized   [facility] Primary Care & Sports Medicine at [facility], Ocie Bob, MD       Future Appointments             In 7 months Ashley Royalty, Ocie Bob, MD [facility] Health Primary Care & Sports Medicine at [facility], [facility]            Signed Prescriptions Disp Refills   amLODipine (NORVASC) 5 MG tablet 90 tablet 0    Sig: TAKE 1 TABLET (5 MG TOTAL) BY MOUTH DAILY.     Cardiovascular: Calcium Channel Blockers 2 Passed - 06/17/2023  1:14 AM      Passed - Last BP in normal range    BP Readings from Last 1 Encounters:  03/25/23 128/84         Passed - Last Heart Rate  in normal range    Pulse Readings from Last 1 Encounters:  03/25/23 78         Passed - Valid encounter within last 6 months    Recent Outpatient Visits           1 month ago Environmental and seasonal allergies   [place] [facility] at [facility], Ocie Bob, MD   2 months ago Anxiety, generalized   [place] [facility] at [facility], Ocie Bob, MD   5 months ago Healthcare maintenance   [facility] Primary Care & Sports Medicine at [facility], Ocie Bob, MD   6 months ago Primary hypertension   [place] Primary Care & Sports Medicine at [facility], Ocie Bob, MD   10 months ago Anxiety, generalized   [facility] Primary Care & Sports Medicine at [facility], Ocie Bob, MD       Future Appointments             In 7 months Ashley Royalty, Ocie Bob, MD [facility] Primary Care & Sports Medicine at  [facility], [facility]

## 2023-06-17 NOTE — Telephone Encounter (Signed)
Requested Prescriptions  Pending Prescriptions Disp Refills   busPIRone (BUSPAR) 5 MG tablet [Pharmacy Med Name: BUSPIRONE HCL 5 MG TABLET] 360 tablet 1    Sig: TAKE 2 TABLETS (10 MG TOTAL) BY MOUTH 2 (TWO) TIMES DAILY. TAKE 1 TABLET (5 MG) BY MOUTH TWICE DAILY. AFTER 1 WEEK CAN INCREASE BY 1 TABLET (5 MG) WEEKLY. DO NOT EXCEED 60 MG / DAY.     Psychiatry: Anxiolytics/Hypnotics - Non-controlled Passed - 06/17/2023  1:14 AM      Passed - Valid encounter within last 12 months    Recent Outpatient Visits           1 month ago Environmental and seasonal allergies   [place] [facility] at [facility], Ocie Bob, MD   2 months ago Anxiety, generalized   [place] [facility] Medicine at [facility], Ocie Bob, MD   5 months ago Healthcare maintenance   [facility] Primary Care & Sports Medicine at [facility], Ocie Bob, MD   6 months ago Primary hypertension   [place] [facility] at [facility], Ocie Bob, MD   10 months ago Anxiety, generalized   [facility] Primary Care & Sports Medicine at [facility], Ocie Bob, MD       Future Appointments             In 7 months Ashley Royalty, Ocie Bob, MD [facility] Primary Care & Sports Medicine at [facility], [facility]             amLODipine (NORVASC) 5 MG tablet [Pharmacy Med Name: AMLODIPINE BESYLATE 5 MG TAB] 30 tablet 1    Sig: TAKE 1 TABLET (5 MG TOTAL) BY MOUTH DAILY.     Cardiovascular: Calcium Channel Blockers 2 Passed - 06/17/2023  1:14 AM      Passed - Last BP in normal range    BP Readings from Last 1 Encounters:  03/25/23 128/84         Passed - Last Heart Rate in normal range    Pulse Readings from Last 1 Encounters:  03/25/23 78         Passed - Valid encounter within last 6 months    Recent Outpatient Visits           1 month ago Environmental and seasonal allergies   [place] [facility] at [facility], Ocie Bob, MD   2 months ago Anxiety, generalized   [place] [facility] Medicine at [facility], Ocie Bob, MD   5 months ago Healthcare maintenance   [facility] Primary Care & Sports Medicine at [facility], Ocie Bob, MD   6 months ago Primary hypertension    Primary Care & Sports Medicine at [facility], Ocie Bob, MD   10 months ago Anxiety, generalized   [facility] Primary Care & Sports Medicine at [facility], Ocie Bob, MD       Future Appointments             In 7 months Ashley Royalty, Ocie Bob, MD [facility] Health Primary Care & Sports Medicine at [facility], [facility]

## 2023-06-29 DIAGNOSIS — G4733 Obstructive sleep apnea (adult) (pediatric): Secondary | ICD-10-CM | POA: Diagnosis not present

## 2023-07-15 ENCOUNTER — Encounter: Payer: Self-pay | Admitting: Family Medicine

## 2023-07-16 ENCOUNTER — Other Ambulatory Visit: Payer: Self-pay | Admitting: Family Medicine

## 2023-07-16 ENCOUNTER — Other Ambulatory Visit: Payer: Self-pay

## 2023-07-16 DIAGNOSIS — E782 Mixed hyperlipidemia: Secondary | ICD-10-CM

## 2023-07-16 DIAGNOSIS — F411 Generalized anxiety disorder: Secondary | ICD-10-CM

## 2023-07-16 DIAGNOSIS — F419 Anxiety disorder, unspecified: Secondary | ICD-10-CM

## 2023-07-16 MED ORDER — PITAVASTATIN CALCIUM 2 MG PO TABS: 1.0000 | ORAL_TABLET | Freq: Every day | ORAL | 0 refills | Status: DC

## 2023-07-16 NOTE — Telephone Encounter (Signed)
Requested Prescriptions  Pending Prescriptions Disp Refills   sertraline (ZOLOFT) 50 MG tablet [Pharmacy Med Name: SERTRALINE HCL 50 MG TABLET] 90 tablet 1    Sig: TAKE 1 TABLET BY MOUTH EVERY DAY     Psychiatry:  Antidepressants - SSRI - sertraline Passed - 07/16/2023  1:28 AM      Passed - AST in normal range and within 360 days    AST  Date Value Ref Range Status  01/09/2023 16 0 - 40 IU/L Final         Passed - ALT in normal range and within 360 days    ALT  Date Value Ref Range Status  01/09/2023 24 0 - 44 IU/L Final         Passed - Completed PHQ-2 or PHQ-9 in the last 360 days      Passed - Valid encounter within last 6 months    Recent Outpatient Visits           2 months ago Environmental and seasonal allergies   [place] [facility] at [facility], Ocie Bob, MD   3 months ago Anxiety, generalized   [place] [facility] at [facility], Ocie Bob, MD   6 months ago Healthcare maintenance   [facility] Primary Care & Sports Medicine at [facility], Ocie Bob, MD   7 months ago Primary hypertension   [place] Primary Care & Sports Medicine at [facility], Ocie Bob, MD   11 months ago Anxiety, generalized   [facility] Care & Sports Medicine at [facility], Ocie Bob, MD       Future Appointments             In 6 months Ashley Royalty, Ocie Bob, MD [facility] Health Primary Care & Sports Medicine at [facility], [facility]

## 2023-07-17 NOTE — Telephone Encounter (Signed)
Requested Prescriptions  Refused Prescriptions Disp Refills   busPIRone (BUSPAR) 5 MG tablet [Pharmacy Med Name: BUSPIRONE HCL 5 MG TABLET] 540 tablet 2    Sig: TAKE 2 TABLETS (10 MG TOTAL) BY MOUTH 2 (TWO) TIMES DAILY. TAKE 1 TABLET (5 MG) BY MOUTH TWICE DAILY. AFTER 1 WEEK CAN INCREASE BY 1 TABLET (5 MG) WEEKLY. DO NOT EXCEED 60 MG / DAY.     Psychiatry: Anxiolytics/Hypnotics - Non-controlled Passed - 07/16/2023  2:31 PM      Passed - Valid encounter within last 12 months    Recent Outpatient Visits           2 months ago Environmental and seasonal allergies   [place] [facility], Ocie Bob, MD   3 months ago Anxiety, generalized   [place] [facility] at [facility], Ocie Bob, MD   6 months ago Healthcare maintenance   [facility] Care & [facility] Medicine at [facility], Ocie Bob, MD   7 months ago Primary hypertension   [place] [facility] at [facility], Ocie Bob, MD   11 months ago Anxiety, generalized   [facility] & Sports Medicine at [facility], Ocie Bob, MD       Future Appointments             In 6 months Ashley Royalty, Ocie Bob, MD [facility] Primary Care & Sports Medicine at [facility], [facility]

## 2023-08-06 ENCOUNTER — Other Ambulatory Visit: Payer: Self-pay | Admitting: Family Medicine

## 2023-08-06 DIAGNOSIS — I10 Essential (primary) hypertension: Secondary | ICD-10-CM

## 2023-08-10 NOTE — Telephone Encounter (Signed)
Requested Prescriptions  Pending Prescriptions Disp Refills   lisinopril (ZESTRIL) 10 MG tablet [Pharmacy Med Name: LISINOPRIL 10 MG TABLET] 30 tablet 2    Sig: TAKE 1 TABLET BY MOUTH EVERY DAY     Cardiovascular:  ACE Inhibitors Failed - 08/06/2023  1:35 AM      Failed - Cr in normal range and within 180 days    Creatinine, Ser  Date Value Ref Range Status  01/09/2023 0.92 0.76 - 1.27 mg/dL Final         Failed - K in normal range and within 180 days    Potassium  Date Value Ref Range Status  01/09/2023 4.2 3.5 - 5.2 mmol/L Final         Passed - Patient is not pregnant      Passed - Last BP in normal range    BP Readings from Last 1 Encounters:  03/25/23 128/84         Passed - Valid encounter within last 6 months    Recent Outpatient Visits           3 months ago Environmental and seasonal allergies   [place] [facility] at [facility], Ocie Bob, MD   4 months ago Anxiety, generalized   [place] [facility] at [facility], Ocie Bob, MD   7 months ago Healthcare maintenance   [facility] at [facility], Ocie Bob, MD   8 months ago Primary hypertension   [place] [facility] at [facility], Ocie Bob, MD   1 year ago Anxiety, generalized   [facility] Primary Care & Sports Medicine at [facility], Ocie Bob, MD       Future Appointments             In 5 months Ashley Royalty, Ocie Bob, MD [facility] Primary Care & Sports Medicine at [facility], [facility]

## 2023-08-11 ENCOUNTER — Telehealth: Payer: 59 | Admitting: Family Medicine

## 2023-08-11 DIAGNOSIS — J069 Acute upper respiratory infection, unspecified: Secondary | ICD-10-CM | POA: Diagnosis not present

## 2023-08-11 MED ORDER — FLUTICASONE PROPIONATE 50 MCG/ACT NA SUSP: 2.0000 | Freq: Every day | NASAL | 0 refills | Status: AC

## 2023-08-11 MED ORDER — BENZONATATE 100 MG PO CAPS: 100.0000 mg | ORAL_CAPSULE | Freq: Three times a day (TID) | ORAL | 0 refills | Status: DC | PRN

## 2023-08-11 NOTE — Progress Notes (Signed)

## 2023-08-27 ENCOUNTER — Encounter: Payer: Self-pay | Admitting: Family Medicine

## 2023-08-27 ENCOUNTER — Telehealth (INDEPENDENT_AMBULATORY_CARE_PROVIDER_SITE_OTHER): Payer: 59 | Admitting: Family Medicine

## 2023-08-27 DIAGNOSIS — R053 Chronic cough: Secondary | ICD-10-CM

## 2023-08-27 MED ORDER — PROMETHAZINE-DM 6.25-15 MG/5ML PO SYRP: 5.0000 mL | ORAL_SOLUTION | Freq: Four times a day (QID) | ORAL | 0 refills | Status: DC | PRN

## 2023-08-27 MED ORDER — ALBUTEROL SULFATE HFA 108 (90 BASE) MCG/ACT IN AERS: 2.0000 | INHALATION_SPRAY | RESPIRATORY_TRACT | 0 refills | Status: DC | PRN

## 2023-08-27 NOTE — Assessment & Plan Note (Signed)
History of Present Illness The patient, with a history of hypertension managed with amlodipine and lisinopril, presents with a persistent minimally to nonproductive cough for the past six months. The cough is primarily described as unproductive, with a sensation of a tickle in the back of the throat. The cough is intermittent, with periods of hours without coughing, but once it starts, it is difficult to stop. The patient denies any chest symptoms associated with the cough, but reports a brief period of raspy breathing during the peak of a recent cold, which has since resolved. The cough is localized high up and far back in the throat, per patient, without any associated burning sensation.   Results RADIOLOGY Chest x-ray: Normal (2023)  Assessment & Plan Chronic Cough Persistent cough for six months, described as high up and far back in the throat. No associated reflux symptoms. Possible causes discussed include ACE inhibitor-induced cough, cough variant asthma, or sequela of serial upper respiratory infections. -Discontinue Lisinopril after resolution of current flu-like symptoms to assess for ACE inhibitor-induced cough. -Start Albuterol inhaler as a diagnostic and therapeutic trial for cough variant asthma. -Order chest X-ray to rule out pneumonia. -Prescribe Promethazine with Dextromethorphan for interim symptomatic relief.  Possible Ear Infection Recent onset of ear pain, possibly related to recent flu-like illness. -Monitor symptoms and message doctor if symptoms persist or worsen after Christmas for possible antibiotic treatment.  Flu-like Illness Current symptoms of cough and sleep disturbance, possibly related to recent flu exposure in the household. -Continue supportive care.

## 2023-08-27 NOTE — Patient Instructions (Signed)
-   Chronic Cough:   - Start using an Albuterol inhaler and contact us with response.   - Obtain a chest X-ray.   - Use Rx cough syrup as needed for symptom relief.   - If still symptomatic despite the above, discontinue Lisinopril after flu-like symptoms resolve.  - Possible Ear Infection:   - Monitor ear pain.   - Contact us if pain persists or worsens after Christmas for possible antibiotic treatment.

## 2023-08-27 NOTE — Progress Notes (Signed)
Primary Care / Sports Medicine Virtual Visit  Patient Information:  Patient ID: Richard Donovan, male DOB: 03/10/1986 Age: 37 y.o. MRN: [id number]   Richard Donovan is a pleasant 37 y.o. male presenting with the following:  Chief Complaint  Patient presents with   Cough    Cough X 6 months, treatments tried OTC cough meds, allergy meds. No relief.     Review of Systems: No fevers, chills, night sweats, weight loss, chest pain, or shortness of breath.   Patient Active Problem List   Diagnosis Date Noted   Chronic cough 08/27/2023   Low libido 03/25/2023   Healthcare maintenance 01/12/2023   Mixed hyperlipidemia 01/12/2023   OSA on CPAP 12/12/2022   Hypertension 12/12/2022   Annual physical exam 12/10/2021   Chronic pain of both shoulders 11/04/2021   Biceps tendinitis 10/24/2021   Anxiety, generalized 10/04/2013   Environmental and seasonal allergies 10/04/1998   Past Medical History:  Diagnosis Date   Allergies    Allergy    Anxiety    Family history of adverse reaction to anesthesia    Mother - slow to wake   Hypertension    Mixed hyperlipidemia 01/12/2023   Sleep apnea    CPAP   Outpatient Encounter Medications as of 08/27/2023  Medication Sig   albuterol (VENTOLIN HFA) 108 (90 Base) MCG/ACT inhaler Inhale 2 puffs into the lungs every 4 (four) hours as needed for wheezing (and cough).   amLODipine (NORVASC) 5 MG tablet TAKE 1 TABLET (5 MG TOTAL) BY MOUTH DAILY.   benzonatate (TESSALON) 100 MG capsule Take 1 capsule (100 mg total) by mouth 3 (three) times daily as needed for cough.   busPIRone (BUSPAR) 5 MG tablet TAKE 2 TABLETS (10 MG TOTAL) BY MOUTH 2 (TWO) TIMES DAILY. TAKE 1 TABLET (5 MG) BY MOUTH TWICE DAILY. AFTER 1 WEEK CAN INCREASE BY 1 TABLET (5 MG) WEEKLY. DO NOT EXCEED 60 MG / DAY.   fluticasone (FLONASE) 50 MCG/ACT nasal spray Place 2 sprays into both nostrils daily.   lisinopril (ZESTRIL) 10 MG tablet TAKE 1 TABLET BY MOUTH EVERY DAY   Pitavastatin Calcium  2 MG TABS Take 1 tablet (2 mg total) by mouth daily.   promethazine-dextromethorphan (PROMETHAZINE-DM) 6.25-15 MG/5ML syrup Take 5 mLs by mouth 4 (four) times daily as needed for cough.   propranolol (INDERAL) 10 MG tablet Take 2 tablets (20 mg total) by mouth 2 (two) times daily as needed.   sertraline (ZOLOFT) 50 MG tablet TAKE 1 TABLET BY MOUTH EVERY DAY   sildenafil (VIAGRA) 100 MG tablet Take 0.5-1 tablets (50-100 mg total) by mouth as needed for erectile dysfunction (for use prior to sexual activity).   [DISCONTINUED] amoxicillin-clavulanate (AUGMENTIN) 875-125 MG tablet Take 1 tablet by mouth 2 (two) times daily. (Patient not taking: Reported on 08/27/2023)   No facility-administered encounter medications on file as of 08/27/2023.   Past Surgical History:  Procedure Laterality Date   ADENOIDECTOMY     EAR TUBE REMOVAL     ETHMOIDECTOMY Left 06/26/2021   Procedure: ANTERIOR ETHMOIDECTOMY;  Surgeon: Bud Face, MD;  Location: [facility];  Service: ENT;  Laterality: Left;  sleep apnea   FRONTAL SINUS EXPLORATION Left 06/26/2021   Procedure: FRONTAL SINUS EXPLORATION;  Surgeon: Bud Face, MD;  Location: [facility];  Service: ENT;  Laterality: Left;   IMAGE GUIDED SINUS SURGERY N/A 06/26/2021   Procedure: IMAGE GUIDED SINUS SURGERY;  Surgeon: Bud Face, MD;  Location: [facility];  Service: ENT;  Laterality: N/A;  need stryker disk placed disk on OR CHARGE nurse desk 10-11 kp   MAXILLARY ANTROSTOMY Bilateral 06/26/2021   Procedure: MAXILLARY ANTROSTOMY WITH TISSUE REMOVAL;  Surgeon: Bud Face, MD;  Location[facility];  Service: ENT;  Laterality: Bilateral;   NASAL SEPTOPLASTY W/ TURBINOPLASTY Bilateral 06/26/2021   Procedure: NASAL SEPTOPLASTY WITH INFERIOR TURBINATE REDUCTION;  Surgeon: Bud Face, MD;  Location[facility];  Service: ENT;  Laterality: Bilateral;   TYMPANOSTOMY TUBE PLACEMENT Bilateral     VASECTOMY  03/19/2022   WISDOM TOOTH EXTRACTION      Virtual Visit via MyChart Video:   I connected with Richard Donovan on 08/27/23 via MyChart Video and verified that I am speaking with the correct person using appropriate identifiers.   The limitations, risks, security and privacy concerns of performing an evaluation and management service by MyChart Video, including the higher likelihood of inaccurate diagnoses and treatments, and the availability of in person appointments were reviewed. The possible need of an additional face-to-face encounter for complete and high quality delivery of care was discussed. The patient was also made aware that there may be a patient responsible charge related to this service. The patient expressed understanding and wishes to proceed.  Provider location is in medical facility. Patient location is at their home, different from provider location. People involved in care of the patient during this telehealth encounter were myself, my nurse/medical assistant, and my front office/scheduling team member.  Objective findings:   General: Speaking full sentences, no audible heavy breathing. Sounds alert and appropriately interactive. Well-appearing. Face symmetric. Extraocular movements intact. Pupils equal and round. No nasal flaring or accessory muscle use visualized.  Independent interpretation of notes and tests performed by another provider:   None  Pertinent History, Exam, Impression, and Recommendations:   Problem List Items Addressed This Visit       Other   Chronic cough - Primary   History of Present Illness The patient, with a history of hypertension managed with amlodipine and lisinopril, presents with a persistent minimally to nonproductive cough for the past six months. The cough is primarily described as unproductive, with a sensation of a tickle in the back of the throat. The cough is intermittent, with periods of hours without coughing, but once  it starts, it is difficult to stop. The patient denies any chest symptoms associated with the cough, but reports a brief period of raspy breathing during the peak of a recent cold, which has since resolved. The cough is localized high up and far back in the throat, per patient, without any associated burning sensation.   Results RADIOLOGY Chest x-ray: Normal (2023)  Assessment & Plan Chronic Cough Persistent cough for six months, described as high up and far back in the throat. No associated reflux symptoms. Possible causes discussed include ACE inhibitor-induced cough, cough variant asthma, or sequela of serial upper respiratory infections. -Discontinue Lisinopril after resolution of current flu-like symptoms to assess for ACE inhibitor-induced cough. -Start Albuterol inhaler as a diagnostic and therapeutic trial for cough variant asthma. -Order chest X-ray to rule out pneumonia. -Prescribe Promethazine with Dextromethorphan for interim symptomatic relief.  Possible Ear Infection Recent onset of ear pain, possibly related to recent flu-like illness. -Monitor symptoms and message doctor if symptoms persist or worsen after Christmas for possible antibiotic treatment.  Flu-like Illness Current symptoms of cough and sleep disturbance, possibly related to recent flu exposure in the household. -Continue supportive care.  Relevant Medications   albuterol (VENTOLIN HFA) 108 (90 Base) MCG/ACT inhaler   promethazine-dextromethorphan (PROMETHAZINE-DM) 6.25-15 MG/5ML syrup   Other Relevant Orders   DG Chest 2 View     Orders & Medications Medications:  Meds ordered this encounter  Medications   albuterol (VENTOLIN HFA) 108 (90 Base) MCG/ACT inhaler    Sig: Inhale 2 puffs into the lungs every 4 (four) hours as needed for wheezing (and cough).    Dispense:  1 each    Refill:  0    Please use generic pro-air   promethazine-dextromethorphan (PROMETHAZINE-DM) 6.25-15 MG/5ML syrup    Sig:  Take 5 mLs by mouth 4 (four) times daily as needed for cough.    Dispense:  118 mL    Refill:  0   Orders Placed This Encounter  Procedures   DG Chest 2 View     I discussed the above assessment and treatment plan with the patient. The patient was provided an opportunity to ask questions and all were answered. The patient agreed with the plan and demonstrated an understanding of the instructions.   The patient was advised to call back or seek an in-person evaluation if the symptoms worsen or if the condition fails to improve as anticipated.   I provided a total time of 30 minutes including both face-to-face and non-face-to-face time on 08/27/2023 inclusive of time utilized for medical chart review, information gathering, care coordination with staff, and documentation completion.    [name], MD, [facility]   Primary Care Sports Medicine Primary Care and Sports Medicine at [facility]

## 2023-08-29 DIAGNOSIS — G4733 Obstructive sleep apnea (adult) (pediatric): Secondary | ICD-10-CM | POA: Diagnosis not present

## 2023-09-06 ENCOUNTER — Other Ambulatory Visit: Payer: Self-pay | Admitting: Family Medicine

## 2023-09-06 DIAGNOSIS — I10 Essential (primary) hypertension: Secondary | ICD-10-CM

## 2023-09-10 NOTE — Telephone Encounter (Signed)
 Requested Prescriptions  Pending Prescriptions Disp Refills   amLODipine  (NORVASC ) 5 MG tablet [Pharmacy Med Name: AMLODIPINE  BESYLATE 5 MG TAB] 30 tablet 2    Sig: TAKE 1 TABLET (5 MG TOTAL) BY MOUTH DAILY.     Cardiovascular: Calcium  Channel Blockers 2 Passed - 09/10/2023  1:50 PM      Passed - Last BP in normal range    BP Readings from Last 1 Encounters:  03/25/23 128/84         Passed - Last Heart Rate in normal range    Pulse Readings from Last 1 Encounters:  03/25/23 78         Passed - Valid encounter within last 6 months    Recent Outpatient Visits           2 weeks ago Chronic cough   [place] [facility], Selinda PARAS, MD   4 months ago Environmental and seasonal allergies    [facility] Medicine at [facility], Selinda PARAS, MD   5 months ago Anxiety, generalized   [facility] at [facility], Selinda PARAS, MD   8 months ago Healthcare maintenance   [facility] at [facility], Selinda PARAS, MD   9 months ago Primary hypertension   [facility] Primary Care & Sports Medicine at [facility], Selinda PARAS, MD       Future Appointments             In 4 months Ku, Selinda PARAS, MD [facility] Primary Care & Sports Medicine at [facility], [facility]

## 2023-09-17 ENCOUNTER — Other Ambulatory Visit: Payer: Self-pay | Admitting: Family Medicine

## 2023-09-17 DIAGNOSIS — R053 Chronic cough: Secondary | ICD-10-CM

## 2023-09-21 NOTE — Telephone Encounter (Signed)
 Requested Prescriptions  Pending Prescriptions Disp Refills   albuterol  (VENTOLIN  HFA) 108 (90 Base) MCG/ACT inhaler [Pharmacy Med Name: ALBUTEROL  HFA (PROAIR ) INHALER] 8.5 each 0    Sig: INHALE 2 PUFFS INTO THE LUNGS EVERY 4 (FOUR) HOURS AS NEEDED FOR WHEEZING (AND COUGH).     Pulmonology:  Beta Agonists 2 Passed - 09/21/2023  9:25 AM      Passed - Last BP in normal range    BP Readings from Last 1 Encounters:  03/25/23 128/84         Passed - Last Heart Rate in normal range    Pulse Readings from Last 1 Encounters:  03/25/23 78         Passed - Valid encounter within last 12 months    Recent Outpatient Visits           3 weeks ago Chronic cough   [place] [facility], Selinda PARAS, MD   4 months ago Environmental and seasonal allergies   [place] [facility] at [facility], Selinda PARAS, MD   6 months ago Anxiety, generalized   [facility] Primary Care & Sports Medicine at [facility], Selinda PARAS, MD   8 months ago Healthcare maintenance   [facility] Primary Care & Sports Medicine at [facility], Selinda PARAS, MD   9 months ago Primary hypertension   [facility] Primary Care & Sports Medicine at [facility], Selinda PARAS, MD       Future Appointments             In 4 months Ku, Selinda PARAS, MD [facility] Primary Care & Sports Medicine at [facility], [facility]

## 2023-09-30 ENCOUNTER — Other Ambulatory Visit: Payer: Self-pay | Admitting: Family Medicine

## 2023-09-30 DIAGNOSIS — F411 Generalized anxiety disorder: Secondary | ICD-10-CM

## 2023-09-30 NOTE — Telephone Encounter (Signed)
Requested Prescriptions  Pending Prescriptions Disp Refills   propranolol (INDERAL) 10 MG tablet [Pharmacy Med Name: PROPRANOLOL 10 MG TABLET] 360 tablet 3    Sig: TAKE 2 TABLETS (20 MG TOTAL) BY MOUTH 2 (TWO) TIMES DAILY AS NEEDED.     Cardiovascular:  Beta Blockers Passed - 09/30/2023  3:06 PM      Passed - Last BP in normal range    BP Readings from Last 1 Encounters:  03/25/23 128/84         Passed - Last Heart Rate in normal range    Pulse Readings from Last 1 Encounters:  03/25/23 78         Passed - Valid encounter within last 6 months    Recent Outpatient Visits           1 month ago Chronic cough   [place] [facility] at [facility], Ocie Bob, MD   4 months ago Environmental and seasonal allergies   [place] [facility] at [facility], Ocie Bob, MD   6 months ago Anxiety, generalized   [facility] Primary Care & Sports Medicine at [facility], Ocie Bob, MD   8 months ago Healthcare maintenance   [facility] Care & Sports Medicine at [facility], Ocie Bob, MD   9 months ago Primary hypertension   [facility] Primary Care & Sports Medicine at [facility], Ocie Bob, MD       Future Appointments             In 3 months Ashley Royalty, Ocie Bob, MD [facility] Primary Care & Sports Medicine at [facility], [facility]

## 2023-10-17 ENCOUNTER — Other Ambulatory Visit: Payer: Self-pay | Admitting: Family Medicine

## 2023-10-17 DIAGNOSIS — E782 Mixed hyperlipidemia: Secondary | ICD-10-CM

## 2023-10-19 NOTE — Telephone Encounter (Signed)
 Requested Prescriptions  Pending Prescriptions Disp Refills   Pitavastatin  Calcium  2 MG TABS [Pharmacy Med Name: PITAVASTATIN  2 MG TABLET] 90 tablet 0    Sig: TAKE 1 TABLET BY MOUTH EVERY DAY     Cardiovascular:  Antilipid - Statins 2 Failed - 10/19/2023 11:54 AM      Failed - Lipid Panel in normal range within the last 12 months    Cholesterol, Total  Date Value Ref Range Status  01/09/2023 224 (H) 100 - 199 mg/dL Final   LDL Chol Calc (NIH)  Date Value Ref Range Status  01/09/2023 159 (H) 0 - 99 mg/dL Final   HDL  Date Value Ref Range Status  01/09/2023 33 (L) >39 mg/dL Final   Triglycerides  Date Value Ref Range Status  01/09/2023 171 (H) 0 - 149 mg/dL Final         Passed - Cr in normal range and within 360 days    Creatinine, Ser  Date Value Ref Range Status  01/09/2023 0.92 0.76 - 1.27 mg/dL Final         Passed - Patient is not pregnant      Passed - Valid encounter within last 12 months    Recent Outpatient Visits           1 month ago Chronic cough   [place] [facility], Dessie Flow, MD   5 months ago Environmental and seasonal allergies   [place] [facility] at [facility], Dessie Flow, MD   6 months ago Anxiety, generalized   [facility] Primary Care & Sports Medicine at [facility], Dessie Flow, MD   9 months ago Healthcare maintenance   [facility] Care & Sports Medicine at [facility], Dessie Flow, MD   10 months ago Primary hypertension   [facility] Primary Care & Sports Medicine at [facility], Dessie Flow, MD       Future Appointments             In 3 months Augustus Ledger, Dessie Flow, MD [facility] Health Primary Care & Sports Medicine at [facility], [facility]

## 2023-11-01 ENCOUNTER — Other Ambulatory Visit: Payer: Self-pay | Admitting: Family Medicine

## 2023-11-01 DIAGNOSIS — I10 Essential (primary) hypertension: Secondary | ICD-10-CM

## 2023-11-01 DIAGNOSIS — F411 Generalized anxiety disorder: Secondary | ICD-10-CM

## 2023-11-03 NOTE — Telephone Encounter (Signed)
 Requested medications are due for refill today.  unsure  Requested medications are on the active medications list.  yes  Last refill. 06/17/2023 #360 1 rf  Future visit scheduled.   yes  Notes to clinic.  Please review sig.:       Requested Prescriptions  Pending Prescriptions Disp Refills   busPIRone (BUSPAR) 5 MG tablet [Pharmacy Med Name: BUSPIRONE HCL 5 MG TABLET] 180 tablet 3      Sig: TAKE 2 TABLETS (10 MG TOTAL) BY MOUTH 2 (TWO) TIMES DAILY. TAKE 1 TABLET (5 MG) BY MOUTH TWICE DAILY. AFTER 1 WEEK CAN INCREASE BY 1 TABLET (5 MG) WEEKLY. DO NOT EXCEED 60 MG / DAY.     Requested Prescriptions  Pending Prescriptions Disp Refills   busPIRone (BUSPAR) 5 MG tablet [Pharmacy Med Name: BUSPIRONE HCL 5 MG TABLET] 180 tablet 3    Sig: TAKE 2 TABLETS (10 MG TOTAL) BY MOUTH 2 (TWO) TIMES DAILY. TAKE 1 TABLET (5 MG) BY MOUTH TWICE DAILY. AFTER 1 WEEK CAN INCREASE BY 1 TABLET (5 MG) WEEKLY. DO NOT EXCEED 60 MG / DAY.     Psychiatry: Anxiolytics/Hypnotics - Non-controlled Passed - 11/03/2023  9:05 AM      Passed - Valid encounter within last 12 months    Recent Outpatient Visits           2 months ago Chronic cough   [place] [facility], Ocie Bob, MD   5 months ago Environmental and seasonal allergies   [place] [facility] at [facility], Ocie Bob, MD   7 months ago Anxiety, generalized   [place] [facility] at [facility], Ocie Bob, MD   9 months ago Healthcare maintenance   [place] [facility] at [facility], Ocie Bob, MD   10 months ago Primary hypertension   [place] [facility] at [facility], Ocie Bob, MD       Future Appointments             In 2 months Ashley Royalty, Ocie Bob, MD [facility] Primary [facility] Medicine at [facility], [facility]            Signed Prescriptions Disp  Refills   lisinopril (ZESTRIL) 10 MG tablet 30 tablet 2    Sig: TAKE 1 TABLET BY MOUTH EVERY DAY     Cardiovascular:  ACE Inhibitors Failed - 11/03/2023  9:05 AM      Failed - Cr in normal range and within 180 days    Creatinine, Ser  Date Value Ref Range Status  01/09/2023 0.92 0.76 - 1.27 mg/dL Final         Failed - K in normal range and within 180 days    Potassium  Date Value Ref Range Status  01/09/2023 4.2 3.5 - 5.2 mmol/L Final         Passed - Patient is not pregnant      Passed - Last BP in normal range    BP Readings from Last 1 Encounters:  03/25/23 128/84         Passed - Valid encounter within last 6 months    Recent Outpatient Visits           2 months ago Chronic cough   [place] [facility] at [facility], Ocie Bob, MD  5 months ago Environmental and seasonal allergies   [place] [facility] at [facility], Ocie Bob, MD   7 months ago Anxiety, generalized   [facility] at [facility], Ocie Bob, MD   9 months ago Healthcare maintenance   [facility] at [facility], Ocie Bob, MD   10 months ago Primary hypertension   [facility] at [facility], Ocie Bob, MD       Future Appointments             In 2 months Ashley Royalty, Ocie Bob, MD [facility] Primary Care & Sports Medicine at [facility], [facility]

## 2023-11-03 NOTE — Telephone Encounter (Signed)
 Requested Prescriptions  Pending Prescriptions Disp Refills   busPIRone (BUSPAR) 5 MG tablet [Pharmacy Med Name: BUSPIRONE HCL 5 MG TABLET] 180 tablet 3    Sig: TAKE 2 TABLETS (10 MG TOTAL) BY MOUTH 2 (TWO) TIMES DAILY. TAKE 1 TABLET (5 MG) BY MOUTH TWICE DAILY. AFTER 1 WEEK CAN INCREASE BY 1 TABLET (5 MG) WEEKLY. DO NOT EXCEED 60 MG / DAY.     Psychiatry: Anxiolytics/Hypnotics - Non-controlled Passed - 11/03/2023  9:04 AM      Passed - Valid encounter within last 12 months    Recent Outpatient Visits           2 months ago Chronic cough   [place] [facility] at [facility], Ocie Bob, MD   5 months ago Environmental and seasonal allergies   [place] [facility] at [facility], Ocie Bob, MD   7 months ago Anxiety, generalized   [facility] at [facility], Ocie Bob, MD   9 months ago Healthcare maintenance   [place] [facility] at [facility], Ocie Bob, MD   10 months ago Primary hypertension   [place] [facility] at [facility], Ocie Bob, MD       Future Appointments             In 2 months Ashley Royalty, Ocie Bob, MD [facility] at [facility], [facility]             lisinopril (ZESTRIL) 10 MG tablet [Pharmacy Med Name: LISINOPRIL 10 MG TABLET] 30 tablet 2    Sig: TAKE 1 TABLET BY MOUTH EVERY DAY     Cardiovascular:  ACE Inhibitors Failed - 11/03/2023  9:04 AM      Failed - Cr in normal range and within 180 days    Creatinine, Ser  Date Value Ref Range Status  01/09/2023 0.92 0.76 - 1.27 mg/dL Final         Failed - K in normal range and within 180 days    Potassium  Date Value Ref Range Status  01/09/2023 4.2 3.5 - 5.2 mmol/L Final         Passed - Patient is not pregnant      Passed - Last BP in normal range    BP Readings from Last 1 Encounters:  03/25/23  128/84         Passed - Valid encounter within last 6 months    Recent Outpatient Visits           2 months ago Chronic cough   [place] [facility] at [facility], Ocie Bob, MD   5 months ago Environmental and seasonal allergies   [place] [facility] at [facility], Ocie Bob, MD   7 months ago Anxiety, generalized   [facility] at [facility], Ocie Bob, MD   9 months ago Healthcare maintenance   [facility] at [facility], Ocie Bob, MD   10 months ago Primary hypertension    Primary Care & Sports Medicine at [facility], Ocie Bob, MD       Future Appointments             In 2 months Ashley Royalty, Ocie Bob, MD [facility] at [facility], [facility]

## 2023-11-29 ENCOUNTER — Other Ambulatory Visit: Payer: Self-pay | Admitting: Family Medicine

## 2023-11-29 DIAGNOSIS — I10 Essential (primary) hypertension: Secondary | ICD-10-CM

## 2023-12-01 NOTE — Telephone Encounter (Signed)
 Requested medication (s) are due for refill today: last refill 11/06/23  Requested medication (s) are on the active medication list: yes   Last refill:  09/10/23 #30 2 refills   Future visit scheduled: yes in 1 month  Notes to clinic:  Pharmacy comment: REQUEST FOR 90 DAYS PRESCRIPTION.  Do you want to give #30 or #90?     Requested Prescriptions  Pending Prescriptions Disp Refills   amLODipine (NORVASC) 5 MG tablet [Pharmacy Med Name: AMLODIPINE BESYLATE 5 MG TAB] 90 tablet 1    Sig: TAKE 1 TABLET (5 MG TOTAL) BY MOUTH DAILY.     Cardiovascular: Calcium Channel Blockers 2 Passed - 12/01/2023  8:51 AM      Passed - Last BP in normal range    BP Readings from Last 1 Encounters:  03/25/23 128/84         Passed - Last Heart Rate in normal range    Pulse Readings from Last 1 Encounters:  03/25/23 78         Passed - Valid encounter within last 6 months    Recent Outpatient Visits           3 months ago Chronic cough   [place] [facility] at [facility], Ocie Bob, MD   6 months ago Environmental and seasonal allergies   [place] [facility] at [facility], Ocie Bob, MD   8 months ago Anxiety, generalized   [facility] at [facility], Ocie Bob, MD   10 months ago Healthcare maintenance   [facility] at [facility], Ocie Bob, MD   11 months ago Primary hypertension   [facility] Care & [facility] Medicine at [facility], Ocie Bob, MD       Future Appointments             In 1 month Ashley Royalty, Ocie Bob, MD [facility] Primary [facility] & [facility] Medicine at [facility], [facility]

## 2023-12-28 ENCOUNTER — Other Ambulatory Visit: Payer: Self-pay | Admitting: Family Medicine

## 2023-12-28 DIAGNOSIS — I10 Essential (primary) hypertension: Secondary | ICD-10-CM

## 2023-12-28 DIAGNOSIS — F411 Generalized anxiety disorder: Secondary | ICD-10-CM

## 2023-12-28 NOTE — Telephone Encounter (Signed)
 Courtesy refill given Requested Prescriptions  Pending Prescriptions Disp Refills   propranolol  (INDERAL ) 10 MG tablet [Pharmacy Med Name: PROPRANOLOL  10 MG TABLET] 360 tablet 0    Sig: TAKE 2 TABLETS (20 MG TOTAL) BY MOUTH 2 (TWO) TIMES DAILY AS NEEDED.     Cardiovascular:  Beta Blockers Failed - 12/28/2023  4:15 PM      Failed - Valid encounter within last 6 months    Recent Outpatient Visits   None     Future Appointments             In 3 weeks Richard Donovan, Dessie Flow, MD [facility] Primary Care & Sports Medicine at [facility], [facility]            Passed - Last BP in normal range    BP Readings from Last 1 Encounters:  03/25/23 128/84         Passed - Last Heart Rate in normal range    Pulse Readings from Last 1 Encounters:  03/25/23 78

## 2023-12-29 NOTE — Telephone Encounter (Signed)
 Requested Prescriptions  Refused Prescriptions Disp Refills   lisinopril  (ZESTRIL ) 10 MG tablet [Pharmacy Med Name: LISINOPRIL  10 MG TABLET] 90 tablet 1    Sig: TAKE 1 TABLET BY MOUTH EVERY DAY     Cardiovascular:  ACE Inhibitors Failed - 12/29/2023 11:45 AM      Failed - Cr in normal range and within 180 days    Creatinine, Ser  Date Value Ref Range Status  01/09/2023 0.92 0.76 - 1.27 mg/dL Final         Failed - K in normal range and within 180 days    Potassium  Date Value Ref Range Status  01/09/2023 4.2 3.5 - 5.2 mmol/L Final         Failed - Valid encounter within last 6 months    Recent Outpatient Visits   None     Future Appointments             In 3 weeks Augustus Ledger Dessie Flow, MD [facility] Primary Care & Sports Medicine at [facility], [facility]            Passed - Patient is not pregnant      Passed - Last BP in normal range    BP Readings from Last 1 Encounters:  03/25/23 128/84

## 2024-01-15 ENCOUNTER — Encounter: Payer: 59 | Admitting: Family Medicine

## 2024-01-18 ENCOUNTER — Other Ambulatory Visit: Payer: Self-pay | Admitting: Family Medicine

## 2024-01-18 DIAGNOSIS — E782 Mixed hyperlipidemia: Secondary | ICD-10-CM

## 2024-01-19 ENCOUNTER — Other Ambulatory Visit: Payer: Self-pay | Admitting: Family Medicine

## 2024-01-19 ENCOUNTER — Other Ambulatory Visit: Payer: Self-pay

## 2024-01-19 ENCOUNTER — Encounter: Payer: Self-pay | Admitting: Family Medicine

## 2024-01-19 DIAGNOSIS — E782 Mixed hyperlipidemia: Secondary | ICD-10-CM

## 2024-01-19 MED ORDER — PITAVASTATIN CALCIUM 2 MG PO TABS: 1.0000 | ORAL_TABLET | Freq: Every day | ORAL | 0 refills | Status: DC

## 2024-01-19 NOTE — Telephone Encounter (Signed)
 Requested medications are due for refill today.  yes  Requested medications are on the active medications list.  yes  Last refill. 10/19/2023 #90 0 rf  Future visit scheduled.   no  Notes to clinic.  Labs are expired.    Requested Prescriptions  Pending Prescriptions Disp Refills   Pitavastatin  Calcium  2 MG TABS [Pharmacy Med Name: PITAVASTATIN  2 MG TABLET] 90 tablet 0    Sig: TAKE 1 TABLET BY MOUTH EVERY DAY     Cardiovascular:  Antilipid - Statins 2 Failed - 01/19/2024  2:36 PM      Failed - Cr in normal range and within 360 days    Creatinine, Ser  Date Value Ref Range Status  01/09/2023 0.92 0.76 - 1.27 mg/dL Final         Failed - Valid encounter within last 12 months    Recent Outpatient Visits   None            Failed - Lipid Panel in normal range within the last 12 months    Cholesterol, Total  Date Value Ref Range Status  01/09/2023 224 (H) 100 - 199 mg/dL Final   LDL Chol Calc (NIH)  Date Value Ref Range Status  01/09/2023 159 (H) 0 - 99 mg/dL Final   HDL  Date Value Ref Range Status  01/09/2023 33 (L) >39 mg/dL Final   Triglycerides  Date Value Ref Range Status  01/09/2023 171 (H) 0 - 149 mg/dL Final         Passed - Patient is not pregnant

## 2024-01-20 ENCOUNTER — Other Ambulatory Visit: Payer: Self-pay | Admitting: Family Medicine

## 2024-01-20 DIAGNOSIS — F411 Generalized anxiety disorder: Secondary | ICD-10-CM

## 2024-01-21 ENCOUNTER — Encounter: Payer: Self-pay | Admitting: Family Medicine

## 2024-01-21 NOTE — Telephone Encounter (Signed)
 Requested medications are due for refill today.  yes  Requested medications are on the active medications list.  yes  Last refill. 12/28/2023 #120 0 rf  Future visit scheduled.   no  Notes to clinic.  Pt is overdue for an ov. Pt already given a courtesy refill.    Requested Prescriptions  Pending Prescriptions Disp Refills   propranolol  (INDERAL ) 10 MG tablet [Pharmacy Med Name: PROPRANOLOL  10 MG TABLET] 360 tablet 1    Sig: TAKE 2 TABLETS (20 MG TOTAL) BY MOUTH 2 (TWO) TIMES DAILY AS NEEDED.     Cardiovascular:  Beta Blockers Failed - 01/21/2024  3:16 PM      Failed - Valid encounter within last 6 months    Recent Outpatient Visits   None            Passed - Last BP in normal range    BP Readings from Last 1 Encounters:  03/25/23 128/84         Passed - Last Heart Rate in normal range    Pulse Readings from Last 1 Encounters:  03/25/23 78

## 2024-01-31 ENCOUNTER — Other Ambulatory Visit: Payer: Self-pay | Admitting: Family Medicine

## 2024-01-31 DIAGNOSIS — I10 Essential (primary) hypertension: Secondary | ICD-10-CM

## 2024-02-03 NOTE — Telephone Encounter (Signed)
 Requested medication (s) are due for refill today: yes  Requested medication (s) are on the active medication list: yes  Last refill:  11/03/23 #30 2 RF  Future visit scheduled: no  Notes to clinic:   overdue lab work    Requested Prescriptions  Pending Prescriptions Disp Refills   lisinopril  (ZESTRIL ) 10 MG tablet [Pharmacy Med Name: LISINOPRIL  10 MG TABLET] 30 tablet 2    Sig: TAKE 1 TABLET BY MOUTH EVERY DAY     Cardiovascular:  ACE Inhibitors Failed - 02/03/2024  1:56 PM      Failed - Cr in normal range and within 180 days    Creatinine, Ser  Date Value Ref Range Status  01/09/2023 0.92 0.76 - 1.27 mg/dL Final         Failed - K in normal range and within 180 days    Potassium  Date Value Ref Range Status  01/09/2023 4.2 3.5 - 5.2 mmol/L Final         Failed - Valid encounter within last 6 months    Recent Outpatient Visits   None            Passed - Patient is not pregnant      Passed - Last BP in normal range    BP Readings from Last 1 Encounters:  03/25/23 128/84

## 2024-02-18 ENCOUNTER — Other Ambulatory Visit: Payer: Self-pay | Admitting: Family Medicine

## 2024-02-18 DIAGNOSIS — F411 Generalized anxiety disorder: Secondary | ICD-10-CM

## 2024-02-19 NOTE — Telephone Encounter (Signed)
 Requested Prescriptions  Pending Prescriptions Disp Refills   propranolol  (INDERAL ) 10 MG tablet [Pharmacy Med Name: PROPRANOLOL  10 MG TABLET] 360 tablet 0    Sig: TAKE 2 TABLETS (20 MG TOTAL) BY MOUTH 2 (TWO) TIMES DAILY AS NEEDED.     Cardiovascular:  Beta Blockers Failed - 02/19/2024 12:30 PM      Failed - Valid encounter within last 6 months    Recent Outpatient Visits   None            Passed - Last BP in normal range    BP Readings from Last 1 Encounters:  03/25/23 128/84         Passed - Last Heart Rate in normal range    Pulse Readings from Last 1 Encounters:  03/25/23 78

## 2024-03-01 ENCOUNTER — Other Ambulatory Visit: Payer: Self-pay | Admitting: Family Medicine

## 2024-03-01 DIAGNOSIS — I10 Essential (primary) hypertension: Secondary | ICD-10-CM

## 2024-03-02 NOTE — Telephone Encounter (Signed)
 Requested Prescriptions  Pending Prescriptions Disp Refills   amLODipine  (NORVASC ) 5 MG tablet [Pharmacy Med Name: AMLODIPINE  BESYLATE 5 MG TAB] 90 tablet 0    Sig: TAKE 1 TABLET (5 MG TOTAL) BY MOUTH DAILY.     Cardiovascular: Calcium  Channel Blockers 2 Failed - 03/02/2024  1:03 PM      Failed - Valid encounter within last 6 months    Recent Outpatient Visits   None            Passed - Last BP in normal range    BP Readings from Last 1 Encounters:  03/25/23 128/84         Passed - Last Heart Rate in normal range    Pulse Readings from Last 1 Encounters:  03/25/23 78

## 2024-03-07 ENCOUNTER — Other Ambulatory Visit: Payer: Self-pay | Admitting: Family Medicine

## 2024-03-07 DIAGNOSIS — F411 Generalized anxiety disorder: Secondary | ICD-10-CM

## 2024-03-08 NOTE — Telephone Encounter (Signed)
 Unable to refill per protocol, appointment needed.   Requested Prescriptions  Pending Prescriptions Disp Refills   busPIRone  (BUSPAR ) 5 MG tablet [Pharmacy Med Name: BUSPIRONE  HCL 5 MG TABLET] 180 tablet 3    Sig: TAKE 2 TABLETS (10 MG TOTAL) BY MOUTH 2 (TWO) TIMES DAILY. TAKE 1 TABLET (5 MG) BY MOUTH TWICE DAILY. AFTER 1 WEEK CAN INCREASE BY 1 TABLET (5 MG) WEEKLY. DO NOT EXCEED 60 MG / DAY.     Psychiatry: Anxiolytics/Hypnotics - Non-controlled Failed - 03/08/2024  3:00 PM      Failed - Valid encounter within last 12 months    Recent Outpatient Visits   None

## 2024-03-26 ENCOUNTER — Other Ambulatory Visit: Payer: Self-pay | Admitting: Family Medicine

## 2024-03-26 DIAGNOSIS — I10 Essential (primary) hypertension: Secondary | ICD-10-CM

## 2024-03-26 DIAGNOSIS — F419 Anxiety disorder, unspecified: Secondary | ICD-10-CM

## 2024-03-29 NOTE — Telephone Encounter (Signed)
 Requested medications are due for refill today.  yes  Requested medications are on the active medications list.  yes  Last refill. 07/16/2023 #90 1 rf  Future visit scheduled.   no  Notes to clinic.  Labs are expired.    Requested Prescriptions  Pending Prescriptions Disp Refills   sertraline  (ZOLOFT ) 50 MG tablet [Pharmacy Med Name: SERTRALINE  HCL 50 MG TABLET] 90 tablet 2    Sig: TAKE 1 TABLET BY MOUTH EVERY DAY     Psychiatry:  Antidepressants - SSRI - sertraline  Failed - 03/29/2024 10:41 AM      Failed - AST in normal range and within 360 days    AST  Date Value Ref Range Status  01/09/2023 16 0 - 40 IU/L Final         Failed - ALT in normal range and within 360 days    ALT  Date Value Ref Range Status  01/09/2023 24 0 - 44 IU/L Final         Failed - Valid encounter within last 6 months    Recent Outpatient Visits   None            Passed - Completed PHQ-2 or PHQ-9 in the last 360 days      Signed Prescriptions Disp Refills   amLODipine  (NORVASC ) 5 MG tablet 30 tablet 0    Sig: TAKE 1 TABLET (5 MG TOTAL) BY MOUTH DAILY.     Cardiovascular: Calcium  Channel Blockers 2 Failed - 03/29/2024 10:41 AM      Failed - Valid encounter within last 6 months    Recent Outpatient Visits   None            Passed - Last BP in normal range    BP Readings from Last 1 Encounters:  03/25/23 128/84         Passed - Last Heart Rate in normal range    Pulse Readings from Last 1 Encounters:  03/25/23 78

## 2024-03-29 NOTE — Telephone Encounter (Signed)
 Courtesy refill. Patient will need an office visit for additional refills.  Requested Prescriptions  Pending Prescriptions Disp Refills   sertraline  (ZOLOFT ) 50 MG tablet [Pharmacy Med Name: SERTRALINE  HCL 50 MG TABLET] 90 tablet 2    Sig: TAKE 1 TABLET BY MOUTH EVERY DAY     Psychiatry:  Antidepressants - SSRI - sertraline  Failed - 03/29/2024 10:41 AM      Failed - AST in normal range and within 360 days    AST  Date Value Ref Range Status  01/09/2023 16 0 - 40 IU/L Final         Failed - ALT in normal range and within 360 days    ALT  Date Value Ref Range Status  01/09/2023 24 0 - 44 IU/L Final         Failed - Valid encounter within last 6 months    Recent Outpatient Visits   None            Passed - Completed PHQ-2 or PHQ-9 in the last 360 days       amLODipine  (NORVASC ) 5 MG tablet [Pharmacy Med Name: AMLODIPINE  BESYLATE 5 MG TAB] 30 tablet 0    Sig: TAKE 1 TABLET (5 MG TOTAL) BY MOUTH DAILY.     Cardiovascular: Calcium  Channel Blockers 2 Failed - 03/29/2024 10:41 AM      Failed - Valid encounter within last 6 months    Recent Outpatient Visits   None            Passed - Last BP in normal range    BP Readings from Last 1 Encounters:  03/25/23 128/84         Passed - Last Heart Rate in normal range    Pulse Readings from Last 1 Encounters:  03/25/23 78

## 2024-04-25 ENCOUNTER — Other Ambulatory Visit: Payer: Self-pay | Admitting: Family Medicine

## 2024-04-25 DIAGNOSIS — E782 Mixed hyperlipidemia: Secondary | ICD-10-CM

## 2024-04-26 ENCOUNTER — Other Ambulatory Visit: Payer: Self-pay | Admitting: Family Medicine

## 2024-04-26 DIAGNOSIS — I10 Essential (primary) hypertension: Secondary | ICD-10-CM

## 2024-04-26 NOTE — Telephone Encounter (Signed)
 Requested medications are due for refill today.  yes  Requested medications are on the active medications list.  yes  Last refill. 01/19/2024 #90 0 rf  Future visit scheduled.   no  Notes to clinic.  Labs are expired.    Requested Prescriptions  Pending Prescriptions Disp Refills   Pitavastatin  Calcium  2 MG TABS [Pharmacy Med Name: PITAVASTATIN  2 MG TABLET] 90 tablet 0    Sig: TAKE 1 TABLET BY MOUTH EVERY DAY     Cardiovascular:  Antilipid - Statins 2 Failed - 04/26/2024  5:41 PM      Failed - Cr in normal range and within 360 days    Creatinine, Ser  Date Value Ref Range Status  01/09/2023 0.92 0.76 - 1.27 mg/dL Final         Failed - Valid encounter within last 12 months    Recent Outpatient Visits   None            Failed - Lipid Panel in normal range within the last 12 months    Cholesterol, Total  Date Value Ref Range Status  01/09/2023 224 (H) 100 - 199 mg/dL Final   LDL Chol Calc (NIH)  Date Value Ref Range Status  01/09/2023 159 (H) 0 - 99 mg/dL Final   HDL  Date Value Ref Range Status  01/09/2023 33 (L) >39 mg/dL Final   Triglycerides  Date Value Ref Range Status  01/09/2023 171 (H) 0 - 149 mg/dL Final         Passed - Patient is not pregnant

## 2024-04-28 NOTE — Telephone Encounter (Signed)
 Unable to refill per protocol, courtesy refill already given, OV needed.  Requested Prescriptions  Pending Prescriptions Disp Refills   amLODipine  (NORVASC ) 5 MG tablet [Pharmacy Med Name: AMLODIPINE  BESYLATE 5 MG TAB] 90 tablet 1    Sig: TAKE 1 TABLET (5 MG TOTAL) BY MOUTH DAILY.     Cardiovascular: Calcium  Channel Blockers 2 Failed - 04/28/2024  9:02 AM      Failed - Valid encounter within last 6 months    Recent Outpatient Visits   None            Passed - Last BP in normal range    BP Readings from Last 1 Encounters:  03/25/23 128/84         Passed - Last Heart Rate in normal range    Pulse Readings from Last 1 Encounters:  03/25/23 78

## 2024-04-30 ENCOUNTER — Other Ambulatory Visit: Payer: Self-pay | Admitting: Family Medicine

## 2024-04-30 DIAGNOSIS — F419 Anxiety disorder, unspecified: Secondary | ICD-10-CM

## 2024-04-30 DIAGNOSIS — I10 Essential (primary) hypertension: Secondary | ICD-10-CM

## 2024-05-02 NOTE — Telephone Encounter (Signed)
 Requested medication (s) are due for refill today - yes  Requested medication (s) are on the active medication list -yes  Future visit scheduled -yes  Last refill: amlodipine - 03/29/24 #30                  Sertraline - 07/15/24 #90 1RF  Notes to clinic: needs appointment- scheduled 05/03/24  Requested Prescriptions  Pending Prescriptions Disp Refills   sertraline  (ZOLOFT ) 50 MG tablet [Pharmacy Med Name: SERTRALINE  HCL 50 MG TABLET] 30 tablet 5    Sig: TAKE 1 TABLET BY MOUTH EVERY DAY     Psychiatry:  Antidepressants - SSRI - sertraline  Failed - 05/02/2024  2:46 PM      Failed - AST in normal range and within 360 days    AST  Date Value Ref Range Status  01/09/2023 16 0 - 40 IU/L Final         Failed - ALT in normal range and within 360 days    ALT  Date Value Ref Range Status  01/09/2023 24 0 - 44 IU/L Final         Failed - Valid encounter within last 6 months    Recent Outpatient Visits   None            Passed - Completed PHQ-2 or PHQ-9 in the last 360 days       amLODipine  (NORVASC ) 5 MG tablet [Pharmacy Med Name: AMLODIPINE  BESYLATE 5 MG TAB] 30 tablet 0    Sig: TAKE 1 TABLET (5 MG TOTAL) BY MOUTH DAILY.     Cardiovascular: Calcium  Channel Blockers 2 Failed - 05/02/2024  2:46 PM      Failed - Valid encounter within last 6 months    Recent Outpatient Visits   None            Passed - Last BP in normal range    BP Readings from Last 1 Encounters:  03/25/23 128/84         Passed - Last Heart Rate in normal range    Pulse Readings from Last 1 Encounters:  03/25/23 78            Requested Prescriptions  Pending Prescriptions Disp Refills   sertraline  (ZOLOFT ) 50 MG tablet [Pharmacy Med Name: SERTRALINE  HCL 50 MG TABLET] 30 tablet 5    Sig: TAKE 1 TABLET BY MOUTH EVERY DAY     Psychiatry:  Antidepressants - SSRI - sertraline  Failed - 05/02/2024  2:46 PM      Failed - AST in normal range and within 360 days    AST  Date Value Ref Range Status   01/09/2023 16 0 - 40 IU/L Final         Failed - ALT in normal range and within 360 days    ALT  Date Value Ref Range Status  01/09/2023 24 0 - 44 IU/L Final         Failed - Valid encounter within last 6 months    Recent Outpatient Visits   None            Passed - Completed PHQ-2 or PHQ-9 in the last 360 days       amLODipine  (NORVASC ) 5 MG tablet [Pharmacy Med Name: AMLODIPINE  BESYLATE 5 MG TAB] 30 tablet 0    Sig: TAKE 1 TABLET (5 MG TOTAL) BY MOUTH DAILY.     Cardiovascular: Calcium  Channel Blockers 2 Failed - 05/02/2024  2:46 PM      Failed - Valid  encounter within last 6 months    Recent Outpatient Visits   None            Passed - Last BP in normal range    BP Readings from Last 1 Encounters:  03/25/23 128/84         Passed - Last Heart Rate in normal range    Pulse Readings from Last 1 Encounters:  03/25/23 78

## 2024-05-03 ENCOUNTER — Encounter: Payer: Self-pay | Admitting: Family Medicine

## 2024-05-03 ENCOUNTER — Ambulatory Visit (INDEPENDENT_AMBULATORY_CARE_PROVIDER_SITE_OTHER): Admitting: Family Medicine

## 2024-05-03 VITALS — BP 130/88 | HR 75 | Ht 73.0 in | Wt 275.8 lb

## 2024-05-03 DIAGNOSIS — Z6836 Body mass index (BMI) 36.0-36.9, adult: Secondary | ICD-10-CM | POA: Diagnosis not present

## 2024-05-03 DIAGNOSIS — G4733 Obstructive sleep apnea (adult) (pediatric): Secondary | ICD-10-CM | POA: Diagnosis not present

## 2024-05-03 DIAGNOSIS — I10 Essential (primary) hypertension: Secondary | ICD-10-CM

## 2024-05-03 DIAGNOSIS — E66812 Obesity, class 2: Secondary | ICD-10-CM | POA: Insufficient documentation

## 2024-05-03 MED ORDER — AMLODIPINE BESY-BENAZEPRIL HCL 5-20 MG PO CAPS: 1.0000 | ORAL_CAPSULE | Freq: Every day | ORAL | 0 refills | Status: DC

## 2024-05-03 NOTE — Patient Instructions (Signed)
 Patient Plan for Post-Visit Guidance  Obesity and Weight Management - Use the provided insurance script to inquire about coverage for [facility] or Zepbound , including under the diagnosis of obstructive sleep apnea. - Discuss out-of-pocket costs and direct purchase options for weight loss medications if insurance does not cover. - Monitor bowel movements and use over-the-counter remedies if needed for constipation.  Hypertension - Start Lotrel (amlodipine  5 mg/benazepril  20 mg) as prescribed. - Monitor blood pressure at home and keep a log of readings. - Contact the office if the medication is unaffordable or if prior authorization is needed.  Obstructive Sleep Apnea (OSA) - Continue using CPAP as directed. - Inquire about insurance coverage for Zepbound  under the sleep apnea diagnosis. - Request documentation of sleep apnea diagnosis if required by insurance.  Red Flags - If you experience severe headache, chest pain, shortness of breath, swelling in the legs, sudden vision changes, or any new or worsening symptoms, seek medical attention promptly.  My physician has determined that I meet the medical necessity criteria for [[place]/Zepbound ] due to a BMI of Body mass index is 36.39 kg/m. , which classifies me as having Class 2 obesity. Given that obesity is a chronic disease with significant health risks, my provider has prescribed this medication as part of a comprehensive weight management plan to reduce my risk of obesity-related complications, including hypertension, type 2 diabetes, and cardiovascular disease. As this medication is FDA-approved for chronic weight management in individuals with a BMI >=30 or >=27 with comorbidities, I am requesting coverage in alignment with standard medical guidelines and my provider's clinical judgment.

## 2024-05-03 NOTE — Assessment & Plan Note (Signed)
 Hypertension and hyperlipidemia management - Current blood pressure is 130/88 mmHg; previous readings include 136/86 mmHg and 130/64 mmHg. - Takes lisinopril  10 mg and amlodipine  5 mg daily. - Cholesterol ratio was 6.8 as of May last year. - Due for a cholesterol check today; not fasting.  Physical Exam VITALS: BP 130/88 CHEST: Lungs clear to auscultation bilaterally, no wheezes, rales, or rhonchi CARDIOVASCULAR: Heart sounds normal with regular rate and rhythm; no murmurs, rubs, or gallops  Essential hypertension Chronic hypertension, current readings 130/88 mmHg. Previously on lisinopril  and amlodipine . Plan to switch to combination pill for ease of adherence. - Prescribe Lotrel (amlodipine  5 mg/benazepril  20 mg). - Instruct to monitor blood pressure at home and report readings. - Advise to contact if medication is unaffordable or prior authorization is needed.

## 2024-05-03 NOTE — Assessment & Plan Note (Signed)
 Weight fluctuations and disordered eating patterns - Significant weight gain over the past year associated with stress. - Pattern of binge eating, often skipping meals during the day and overeating at night. - History of losing 60 pounds through intense dieting and exercise, but difficulty maintaining balanced approach. - Considering medication for weight management due to challenges with willpower. - Concerned about impact of weight on joint pain and sleep apnea.  Obesity Chronic obesity with binge eating and irregular patterns. Interested in pharmacological intervention. Insurance coverage is a barrier, potential coverage under obstructive sleep apnea. - Provide script for insurance inquiry for [facility] or Zepbound , including coverage under obstructive sleep apnea diagnosis. - Discuss out-of-pocket costs and direct purchase options for weight loss medications. - Emphasize monitoring bowel movements and using OTC remedies if needed.

## 2024-05-03 NOTE — Assessment & Plan Note (Signed)
 Obstructive sleep apnea Managed with CPAP. Potential for Zepbound  coverage under sleep apnea indication, aiding weight management and improving symptoms. - Instruct to inquire about insurance coverage for Zepbound  under sleep apnea indication. - Provide documentation of sleep apnea diagnosis if required.

## 2024-05-03 NOTE — Progress Notes (Signed)
 Primary Care / Sports Medicine Office Visit  Patient Information:  Patient ID: Richard Donovan, male DOB: Jun 15, 1986 Age: 38 y.o. MRN: [id number]   Richard Donovan is a pleasant 38 y.o. male presenting with the following:  Chief Complaint  Patient presents with   Medication Refill    Vitals:   05/03/24 0951  BP: 130/88  Pulse: 75  SpO2: 98%   Vitals:   05/03/24 0951  Weight: 275 lb 12.8 oz (125.1 kg)  Height: 6' 1 (1.854 m)   Body mass index is 36.39 kg/m.  No results found.   Independent interpretation of notes and tests performed by another provider:   None  Procedures performed:   None  Pertinent History, Exam, Impression, and Recommendations:   Problem List Items Addressed This Visit     Class 2 obesity with alveolar hypoventilation, serious comorbidity, and body mass index (BMI) of 36.0 to 36.9 in adult (HCC)   Weight fluctuations and disordered eating patterns - Significant weight gain over the past year associated with stress. - Pattern of binge eating, often skipping meals during the day and overeating at night. - History of losing 60 pounds through intense dieting and exercise, but difficulty maintaining balanced approach. - Considering medication for weight management due to challenges with willpower. - Concerned about impact of weight on joint pain and sleep apnea.  Obesity Chronic obesity with binge eating and irregular patterns. Interested in pharmacological intervention. Insurance coverage is a barrier, potential coverage under obstructive sleep apnea. - Provide script for insurance inquiry for [facility] or Zepbound , including coverage under obstructive sleep apnea diagnosis. - Discuss out-of-pocket costs and direct purchase options for weight loss medications. - Emphasize monitoring bowel movements and using OTC remedies if needed.      Relevant Orders   CBC   Hemoglobin A1c   Hypertension - Primary   Hypertension and hyperlipidemia management -  Current blood pressure is 130/88 mmHg; previous readings include 136/86 mmHg and 130/64 mmHg. - Takes lisinopril  10 mg and amlodipine  5 mg daily. - Cholesterol ratio was 6.8 as of May last year. - Due for a cholesterol check today; not fasting.  Physical Exam VITALS: BP 130/88 CHEST: Lungs clear to auscultation bilaterally, no wheezes, rales, or rhonchi CARDIOVASCULAR: Heart sounds normal with regular rate and rhythm; no murmurs, rubs, or gallops  Essential hypertension Chronic hypertension, current readings 130/88 mmHg. Previously on lisinopril  and amlodipine . Plan to switch to combination pill for ease of adherence. - Prescribe Lotrel (amlodipine  5 mg/benazepril  20 mg). - Instruct to monitor blood pressure at home and report readings. - Advise to contact if medication is unaffordable or prior authorization is needed.      Relevant Medications   amLODipine -benazepril  (LOTREL) 5-20 MG capsule   Other Relevant Orders   Comprehensive Metabolic Panel (CMET)   Lipid panel   CBC   OSA on CPAP   Obstructive sleep apnea Managed with CPAP. Potential for Zepbound  coverage under sleep apnea indication, aiding weight management and improving symptoms. - Instruct to inquire about insurance coverage for Zepbound  under sleep apnea indication. - Provide documentation of sleep apnea diagnosis if required.      Relevant Orders   CBC     Orders & Medications Medications:  Meds ordered this encounter  Medications   amLODipine -benazepril  (LOTREL) 5-20 MG capsule    Sig: Take 1 capsule by mouth daily.    Dispense:  90 capsule    Refill:  0   Orders Placed This Encounter  Procedures  Comprehensive Metabolic Panel (CMET)   Lipid panel   CBC   Hemoglobin A1c     No follow-ups on file.     Selinda JINNY Ku, MD, [facility]   Primary Care Sports Medicine Primary Care and Sports Medicine at [facility]

## 2024-05-03 NOTE — Telephone Encounter (Signed)
 Please review and advise.  JM

## 2024-05-04 ENCOUNTER — Other Ambulatory Visit: Payer: Self-pay | Admitting: Family Medicine

## 2024-05-04 DIAGNOSIS — E782 Mixed hyperlipidemia: Secondary | ICD-10-CM

## 2024-05-04 DIAGNOSIS — I10 Essential (primary) hypertension: Secondary | ICD-10-CM

## 2024-05-04 DIAGNOSIS — E662 Morbid (severe) obesity with alveolar hypoventilation: Secondary | ICD-10-CM

## 2024-05-04 DIAGNOSIS — G4733 Obstructive sleep apnea (adult) (pediatric): Secondary | ICD-10-CM

## 2024-05-04 LAB — LIPID PANEL
Chol/HDL Ratio: 5.7 ratio — ABNORMAL HIGH (ref 0.0–5.0)
Cholesterol, Total: 193 mg/dL (ref 100–199)
HDL: 34 mg/dL — ABNORMAL LOW (ref >39)
LDL Chol Calc (NIH): 134 mg/dL — ABNORMAL HIGH (ref 0–99)
Triglycerides: 140 mg/dL (ref 0–149)
VLDL Cholesterol Cal: 25 mg/dL (ref 5–40)

## 2024-05-04 LAB — COMPREHENSIVE METABOLIC PANEL WITH GFR
ALT: 33 IU/L (ref 0–44)
AST: 22 IU/L (ref 0–40)
Albumin: 4.7 g/dL (ref 4.1–5.1)
Alkaline Phosphatase: 73 IU/L (ref 44–121)
BUN/Creatinine Ratio: 13 (ref 9–20)
BUN: 14 mg/dL (ref 6–20)
Bilirubin Total: 0.5 mg/dL (ref 0.0–1.2)
CO2: 22 mmol/L (ref 20–29)
Calcium: 9.7 mg/dL (ref 8.7–10.2)
Chloride: 102 mmol/L (ref 96–106)
Creatinine, Ser: 1.05 mg/dL (ref 0.76–1.27)
Globulin, Total: 2.7 g/dL (ref 1.5–4.5)
Glucose: 90 mg/dL (ref 70–99)
Potassium: 4.7 mmol/L (ref 3.5–5.2)
Sodium: 139 mmol/L (ref 134–144)
Total Protein: 7.4 g/dL (ref 6.0–8.5)
eGFR: 94 mL/min/1.73 (ref >59)

## 2024-05-04 LAB — HEMOGLOBIN A1C
Est. average glucose Bld gHb Est-mCnc: 111 mg/dL
Hgb A1c MFr Bld: 5.5 % (ref 4.8–5.6)

## 2024-05-04 LAB — CBC
Hematocrit: 46.3 % (ref 37.5–51.0)
Hemoglobin: 15.1 g/dL (ref 13.0–17.7)
MCH: 28.3 pg (ref 26.6–33.0)
MCHC: 32.6 g/dL (ref 31.5–35.7)
MCV: 87 fL (ref 79–97)
Platelets: 413 10*3/uL (ref 150–450)
RBC: 5.33 x10E6/uL (ref 4.14–5.80)
RDW: 13.8 % (ref 11.6–15.4)
WBC: 7.4 10*3/uL (ref 3.4–10.8)

## 2024-05-04 MED ORDER — TIRZEPATIDE-WEIGHT MANAGEMENT 5 MG/0.5ML ~~LOC~~ SOLN: 5.0000 mg | SUBCUTANEOUS | 0 refills | Status: DC

## 2024-05-04 NOTE — Telephone Encounter (Signed)
 FYI

## 2024-05-06 ENCOUNTER — Other Ambulatory Visit: Payer: Self-pay | Admitting: Family Medicine

## 2024-05-06 DIAGNOSIS — I10 Essential (primary) hypertension: Secondary | ICD-10-CM

## 2024-05-08 NOTE — Telephone Encounter (Signed)
 Unable to refill per protocol, Rx expired. Discontinued on 05/03/24.  Requested Prescriptions  Pending Prescriptions Disp Refills   lisinopril  (ZESTRIL ) 10 MG tablet [Pharmacy Med Name: LISINOPRIL  10 MG TABLET] 90 tablet     Sig: TAKE 1 TABLET BY MOUTH EVERY DAY     Cardiovascular:  ACE Inhibitors Passed - 05/08/2024  9:11 AM      Passed - Cr in normal range and within 180 days    Creatinine, Ser  Date Value Ref Range Status  05/03/2024 1.05 0.76 - 1.27 mg/dL Final         Passed - K in normal range and within 180 days    Potassium  Date Value Ref Range Status  05/03/2024 4.7 3.5 - 5.2 mmol/L Final         Passed - Patient is not pregnant      Passed - Last BP in normal range    BP Readings from Last 1 Encounters:  05/03/24 130/88         Passed - Valid encounter within last 6 months    Recent Outpatient Visits           5 days ago Primary hypertension   [place] Primary Care & Sports Medicine at [facility], Selinda PARAS, MD       Future Appointments             In 1 month Alvia, Selinda PARAS, MD [facility] Primary Care & Sports Medicine at [facility], [phone] [place]

## 2024-05-12 ENCOUNTER — Other Ambulatory Visit: Payer: Self-pay

## 2024-05-12 ENCOUNTER — Encounter: Payer: Self-pay | Admitting: Family Medicine

## 2024-05-12 DIAGNOSIS — F32A Depression, unspecified: Secondary | ICD-10-CM

## 2024-05-12 MED ORDER — SERTRALINE HCL 50 MG PO TABS: 50.0000 mg | ORAL_TABLET | Freq: Every day | ORAL | 1 refills | Status: DC

## 2024-05-13 ENCOUNTER — Ambulatory Visit: Payer: Self-pay | Admitting: Family Medicine

## 2024-05-13 DIAGNOSIS — E782 Mixed hyperlipidemia: Secondary | ICD-10-CM

## 2024-05-13 MED ORDER — PITAVASTATIN CALCIUM 4 MG PO TABS: 1.0000 | ORAL_TABLET | Freq: Every day | ORAL | 3 refills | Status: AC

## 2024-05-13 MED ORDER — PITAVASTATIN CALCIUM 4 MG PO TABS: 1.0000 | ORAL_TABLET | Freq: Every day | ORAL | 3 refills | Status: DC

## 2024-05-17 ENCOUNTER — Other Ambulatory Visit: Payer: Self-pay | Admitting: Family Medicine

## 2024-05-17 DIAGNOSIS — F411 Generalized anxiety disorder: Secondary | ICD-10-CM

## 2024-05-18 NOTE — Telephone Encounter (Signed)
 Requested Prescriptions  Pending Prescriptions Disp Refills   propranolol  (INDERAL ) 10 MG tablet [Pharmacy Med Name: PROPRANOLOL  10 MG TABLET] 360 tablet 0    Sig: TAKE 2 TABLETS BY MOUTH 2 TIMES DAILY AS NEEDED.     Cardiovascular:  Beta Blockers Passed - 05/18/2024  8:44 AM      Passed - Last BP in normal range    BP Readings from Last 1 Encounters:  05/03/24 130/88         Passed - Last Heart Rate in normal range    Pulse Readings from Last 1 Encounters:  05/03/24 75         Passed - Valid encounter within last 6 months    Recent Outpatient Visits           2 weeks ago Primary hypertension   [place] Primary Care & Sports Medicine at [facility], Selinda PARAS, MD       Future Appointments             In 2 weeks Alvia, Selinda PARAS, MD [facility] Primary Care & Sports Medicine at [facility], [phone] [place]

## 2024-06-07 ENCOUNTER — Ambulatory Visit (INDEPENDENT_AMBULATORY_CARE_PROVIDER_SITE_OTHER): Admitting: Family Medicine

## 2024-06-07 ENCOUNTER — Encounter: Payer: Self-pay | Admitting: Family Medicine

## 2024-06-07 DIAGNOSIS — I10 Essential (primary) hypertension: Secondary | ICD-10-CM

## 2024-06-07 DIAGNOSIS — Z6836 Body mass index (BMI) 36.0-36.9, adult: Secondary | ICD-10-CM

## 2024-06-07 DIAGNOSIS — E66812 Obesity, class 2: Secondary | ICD-10-CM | POA: Diagnosis not present

## 2024-06-07 DIAGNOSIS — G4733 Obstructive sleep apnea (adult) (pediatric): Secondary | ICD-10-CM

## 2024-06-07 DIAGNOSIS — E782 Mixed hyperlipidemia: Secondary | ICD-10-CM | POA: Diagnosis not present

## 2024-06-07 DIAGNOSIS — E662 Morbid (severe) obesity with alveolar hypoventilation: Secondary | ICD-10-CM

## 2024-06-07 MED ORDER — ONDANSETRON 4 MG PO TBDP
4.0000 mg | ORAL_TABLET | Freq: Three times a day (TID) | ORAL | 0 refills | Status: AC | PRN
Start: 2024-06-07 — End: 2024-06-17

## 2024-06-07 MED ORDER — TIRZEPATIDE-WEIGHT MANAGEMENT 5 MG/0.5ML ~~LOC~~ SOLN: 5.0000 mg | SUBCUTANEOUS | 0 refills | Status: AC

## 2024-06-07 MED ORDER — AMLODIPINE BESY-BENAZEPRIL HCL 5-10 MG PO CAPS: 1.0000 | ORAL_CAPSULE | Freq: Every day | ORAL | 3 refills | Status: AC

## 2024-06-07 NOTE — Assessment & Plan Note (Signed)
 History of Present Illness Richard Donovan is a 38 year old male with obesity and hypertension who presents for weight management and medication adjustment.  Weight loss and appetite suppression - Significant weight loss of approximately 20 pounds over the past month - Marked reduction in appetite with sensation of fullness, limiting intake to once daily - Absence of 'food noise' and no longer experiencing lightheadedness from not eating during the day - No hunger despite reduced caloric intake - Adjusting diet to accommodate reduced appetite  Gastrointestinal symptoms - Initial mild queasiness after starting new weight management medication regimen - Severe nausea developed after dose increase to 5 mg last Thursday, possibly exacerbated by concurrent viral illness in family - Constipation since starting 5 mg dose, attributed to decreased fluid intake - No dizziness or lightheadedness with position changes  Hydration and nutritional intake - Decreased fluid intake compared to previous consumption of nearly a gallon of water daily - Use of electrolytes and occasional protein shakes to maintain energy levels  Assessment and Plan Obesity with pharmacologic weight management Significant weight loss with Zepbound . Nausea likely medication-related. Appetite suppression noted. - Continue Zepbound  5 mg. - Discuss dietary adjustments for sustainable intake. - Encourage light meals or snacks to maintain energy. - Monitor weight loss; adjust dose if plateau occurs.  Medication-induced nausea Nausea after Zepbound  dose increase, possibly worsened by viral illness. - Prescribe Zofran  as needed. - Encourage dietary adjustments to minimize Zofran  use.  Medication-induced constipation Constipation likely due to decreased fluid intake and medication effects. - Increase water intake. - Consider Metamucil or Miralax gummies if needed.  Obstructive sleep apnea Diagnosis supports insurance  coverage for Zepbound .

## 2024-06-07 NOTE — Assessment & Plan Note (Signed)
 Antihypertensive and weight management medication use - Currently on a 5 mg dose of a weight management medication, started recently - Previous blood pressure measured at 130/88 mmHg  Physical Exam CHEST: Lungs clear to auscultation bilaterally. No WRR. CARDIOVASCULAR: +S1, S2, Heart sounds normal, regular rate and rhythm. No MRG.  Essential hypertension with medication adjustment Weight loss reduced need for current blood pressure medication. - Reduce Lotrel to 5/10 mg. - Monitor blood pressure; adjust medication as needed. - Return in 6 weeks.

## 2024-06-07 NOTE — Patient Instructions (Signed)
 VISIT SUMMARY:  During your visit, we discussed your significant weight loss, appetite suppression, and gastrointestinal symptoms related to your new weight management medication. We also reviewed your blood pressure and made adjustments to your medications.  YOUR PLAN:  OBESITY WITH PHARMACOLOGIC WEIGHT MANAGEMENT: You have experienced significant weight loss with your current medication, Zepbound , but also noted some nausea and appetite suppression. -Continue taking Zepbound  5 mg. -Adjust your diet to ensure sustainable intake, including light meals or snacks to maintain energy. -Monitor your weight loss and we will adjust the dose if you hit a plateau.  MEDICATION-INDUCED NAUSEA: You have been experiencing nausea after increasing your Zepbound  dose, which may be worsened by a viral illness. -Take Zofran  as needed for nausea. -Adjust your diet to help minimize the need for Zofran .  MEDICATION-INDUCED CONSTIPATION: You have been experiencing constipation, likely due to decreased fluid intake and the effects of your medication. -Increase your water intake. -Consider using Metamucil or Miralax gummies if needed.  ESSENTIAL HYPERTENSION WITH MEDICATION ADJUSTMENT: Your weight loss has reduced the need for your current blood pressure medication. -Reduce your Lotrel dose to 5/10 mg. -Monitor your blood pressure and we will adjust the medication as needed.  OBSTRUCTIVE SLEEP APNEA: Your diagnosis supports insurance coverage for Zepbound . -Continue managing your sleep apnea as advised.

## 2024-06-07 NOTE — Progress Notes (Signed)
 Primary Care / Sports Medicine Office Visit  Patient Information:  Patient ID: Richard Donovan, male DOB: [birth year] Age: 38 y.o. MRN: [id number]   Richard Donovan is a pleasant 38 y.o. male presenting with the following:  Chief Complaint  Patient presents with   Medical Management of Chronic Issues    Patient presents today for HTN, DM. He is on tirzepitide 5 mg. He is having more nausea on this dose and would like to stay on this dose for a bit longer so his body can adjust.     Vitals:   06/07/24 0854  BP: 90/60  Pulse: 85  SpO2: 98%   Vitals:   06/07/24 0854  Weight: 256 lb (116.1 kg)  Height: 6' 1 (1.854 m)   Body mass index is 33.78 kg/m.  No results found.   Discussed the use of AI scribe software for clinical note transcription with the patient, who gave verbal consent to proceed.   Independent interpretation of notes and tests performed by another provider:   None  Procedures performed:   None  Pertinent History, Exam, Impression, and Recommendations:   Problem List Items Addressed This Visit     Class 2 obesity with alveolar hypoventilation, serious comorbidity, and body mass index (BMI) of 36.0 to 36.9 in adult [facility])   History of Present Illness Richard Donovan is a 38 year old male with obesity and hypertension who presents for weight management and medication adjustment.  Weight loss and appetite suppression - Significant weight loss of approximately 20 pounds over the past month - Marked reduction in appetite with sensation of fullness, limiting intake to once daily - Absence of 'food noise' and no longer experiencing lightheadedness from not eating during the day - No hunger despite reduced caloric intake - Adjusting diet to accommodate reduced appetite  Gastrointestinal symptoms - Initial mild queasiness after starting new weight management medication regimen - Severe nausea developed after dose increase to 5 mg last Thursday, possibly  exacerbated by concurrent viral illness in family - Constipation since starting 5 mg dose, attributed to decreased fluid intake - No dizziness or lightheadedness with position changes  Hydration and nutritional intake - Decreased fluid intake compared to previous consumption of nearly a gallon of water daily - Use of electrolytes and occasional protein shakes to maintain energy levels  Assessment and Plan Obesity with pharmacologic weight management Significant weight loss with Zepbound . Nausea likely medication-related. Appetite suppression noted. - Continue Zepbound  5 mg. - Discuss dietary adjustments for sustainable intake. - Encourage light meals or snacks to maintain energy. - Monitor weight loss; adjust dose if plateau occurs.  Medication-induced nausea Nausea after Zepbound  dose increase, possibly worsened by viral illness. - Prescribe Zofran  as needed. - Encourage dietary adjustments to minimize Zofran  use.  Medication-induced constipation Constipation likely due to decreased fluid intake and medication effects. - Increase water intake. - Consider Metamucil or Miralax gummies if needed.  Obstructive sleep apnea Diagnosis supports insurance coverage for Zepbound .      Relevant Medications   tirzepatide  5 MG/0.5ML injection vial   Hypertension   Antihypertensive and weight management medication use - Currently on a 5 mg dose of a weight management medication, started recently - Previous blood pressure measured at 130/88 mmHg  Physical Exam CHEST: Lungs clear to auscultation bilaterally. No WRR. CARDIOVASCULAR: +S1, S2, Heart sounds normal, regular rate and rhythm. No MRG.  Essential hypertension with medication adjustment Weight loss reduced need for current blood pressure medication. - Reduce Lotrel  to 5/10 mg. - Monitor blood pressure; adjust medication as needed. - Return in 6 weeks.      Relevant Medications   amLODipine -benazepril  (LOTREL) 5-10 MG capsule    tirzepatide  5 MG/0.5ML injection vial   Mixed hyperlipidemia   Relevant Medications   amLODipine -benazepril  (LOTREL) 5-10 MG capsule   tirzepatide  5 MG/0.5ML injection vial   OSA on CPAP   Relevant Medications   tirzepatide  5 MG/0.5ML injection vial     Orders & Medications Medications:  Meds ordered this encounter  Medications   amLODipine -benazepril  (LOTREL) 5-10 MG capsule    Sig: Take 1 capsule by mouth daily.    Dispense:  90 capsule    Refill:  3   tirzepatide  5 MG/0.5ML injection vial    Sig: Inject 5 mg into the skin once a week.    Dispense:  2 mL    Refill:  0    PLEASE DISPENSE PENS NOT VIALS   ondansetron  (ZOFRAN -ODT) 4 MG disintegrating tablet    Sig: Take 1 tablet (4 mg total) by mouth every 8 (eight) hours as needed for up to 10 days for nausea or vomiting.    Dispense:  20 tablet    Refill:  0   No orders of the defined types were placed in this encounter.    Return in about 6 weeks (around 07/19/2024) for Weight MGMT f/u.     Selinda JINNY Ku, MD, [facility]   Primary Care Sports Medicine Primary Care and Sports Medicine at [facility]

## 2024-07-19 ENCOUNTER — Ambulatory Visit: Admitting: Family Medicine

## 2024-07-28 ENCOUNTER — Other Ambulatory Visit: Payer: Self-pay | Admitting: Family Medicine

## 2024-07-28 DIAGNOSIS — R6882 Decreased libido: Secondary | ICD-10-CM

## 2024-07-30 NOTE — Telephone Encounter (Signed)
 Requested Prescriptions  Pending Prescriptions Disp Refills   sildenafil  (VIAGRA ) 100 MG tablet [Pharmacy Med Name: SILDENAFIL  100 MG TABLET] 8 tablet 5    Sig: TAKE 0.5-1 TABLETS BY MOUTH AS NEEDED FOR ERECTILE DYSFUNCTION (FOR USE PRIOR TO SEXUAL ACTIVITY).     Urology: Erectile Dysfunction Agents Passed - 07/30/2024  8:44 AM      Passed - AST in normal range and within 360 days    AST  Date Value Ref Range Status  05/03/2024 22 0 - 40 IU/L Final         Passed - ALT in normal range and within 360 days    ALT  Date Value Ref Range Status  05/03/2024 33 0 - 44 IU/L Final         Passed - Last BP in normal range    BP Readings from Last 1 Encounters:  06/07/24 90/60         Passed - Valid encounter within last 12 months    Recent Outpatient Visits           1 month ago Class 2 obesity with alveolar hypoventilation, serious comorbidity, and body mass index (BMI) of 36.0 to 36.9 in adult [facility])   [place] Primary Care & Sports Medicine at [facility], Selinda PARAS, MD   2 months ago Primary hypertension   [facility] Primary Care & Sports Medicine at [facility], Jason J, MD

## 2024-08-08 ENCOUNTER — Ambulatory Visit: Admitting: Family Medicine

## 2024-08-13 ENCOUNTER — Other Ambulatory Visit: Payer: Self-pay | Admitting: Family Medicine

## 2024-08-13 DIAGNOSIS — F411 Generalized anxiety disorder: Secondary | ICD-10-CM

## 2024-08-16 NOTE — Telephone Encounter (Signed)
 Requested Prescriptions  Pending Prescriptions Disp Refills   propranolol  (INDERAL ) 10 MG tablet [Pharmacy Med Name: PROPRANOLOL  10 MG TABLET] 360 tablet 0    Sig: TAKE 2 TABLETS BY MOUTH 2 TIMES DAILY AS NEEDED.     Cardiovascular:  Beta Blockers Passed - 08/16/2024 11:19 AM      Passed - Last BP in normal range    BP Readings from Last 1 Encounters:  06/07/24 90/60         Passed - Last Heart Rate in normal range    Pulse Readings from Last 1 Encounters:  06/07/24 85         Passed - Valid encounter within last 6 months    Recent Outpatient Visits           2 months ago Class 2 obesity with alveolar hypoventilation, serious comorbidity, and body mass index (BMI) of 36.0 to 36.9 in adult [facility])    Primary Care & Sports Medicine at [facility], Selinda PARAS, MD   3 months ago Primary hypertension   [facility] Primary Care & Sports Medicine at [facility], Jason J, MD

## 2024-08-18 ENCOUNTER — Ambulatory Visit: Admitting: Family Medicine

## 2024-08-21 ENCOUNTER — Encounter: Payer: Self-pay | Admitting: Family Medicine

## 2024-08-22 NOTE — Telephone Encounter (Signed)
 Please review and advise.  JM

## 2024-08-29 ENCOUNTER — Telehealth (INDEPENDENT_AMBULATORY_CARE_PROVIDER_SITE_OTHER): Admitting: Family Medicine

## 2024-08-29 ENCOUNTER — Encounter: Payer: Self-pay | Admitting: Family Medicine

## 2024-08-29 DIAGNOSIS — E662 Morbid (severe) obesity with alveolar hypoventilation: Secondary | ICD-10-CM

## 2024-08-29 DIAGNOSIS — R6882 Decreased libido: Secondary | ICD-10-CM

## 2024-08-29 DIAGNOSIS — I10 Essential (primary) hypertension: Secondary | ICD-10-CM | POA: Diagnosis not present

## 2024-08-29 DIAGNOSIS — E66812 Obesity, class 2: Secondary | ICD-10-CM

## 2024-08-29 DIAGNOSIS — F32A Depression, unspecified: Secondary | ICD-10-CM

## 2024-08-29 DIAGNOSIS — F411 Generalized anxiety disorder: Secondary | ICD-10-CM | POA: Diagnosis not present

## 2024-08-29 DIAGNOSIS — Z6836 Body mass index (BMI) 36.0-36.9, adult: Secondary | ICD-10-CM

## 2024-08-29 DIAGNOSIS — F419 Anxiety disorder, unspecified: Secondary | ICD-10-CM

## 2024-08-29 MED ORDER — SERTRALINE HCL 50 MG PO TABS: 50.0000 mg | ORAL_TABLET | Freq: Every day | ORAL | 0 refills | Status: AC

## 2024-08-29 MED ORDER — PROPRANOLOL HCL 10 MG PO TABS: ORAL_TABLET | ORAL | 0 refills | Status: AC

## 2024-08-29 MED ORDER — SILDENAFIL CITRATE 100 MG PO TABS: ORAL_TABLET | ORAL | 5 refills | Status: AC

## 2024-08-29 MED ORDER — BUSPIRONE HCL 10 MG PO TABS: 10.0000 mg | ORAL_TABLET | Freq: Two times a day (BID) | ORAL | 0 refills | Status: AC

## 2024-08-29 NOTE — Progress Notes (Signed)
 "    Primary Care / Sports Medicine Virtual Visit  Patient Information:  Patient ID: Richard Donovan, male DOB: 05/03/1986 Age: 38 y.o. MRN: [id number]   Richard Donovan is a pleasant 38 y.o. male presenting with the following:  Chief Complaint  Patient presents with   Medication Refill    Patient needs refill on Buspirone     Review of Systems: No fevers, chills, night sweats, weight loss, chest pain, or shortness of breath.   Patient Active Problem List   Diagnosis Date Noted   Class 2 obesity with alveolar hypoventilation, serious comorbidity, and body mass index (BMI) of 36.0 to 36.9 in adult [facility]) 05/03/2024   Chronic cough 08/27/2023   Low libido 03/25/2023   Healthcare maintenance 01/12/2023   Mixed hyperlipidemia 01/12/2023   OSA on CPAP 12/12/2022   Hypertension 12/12/2022   Annual physical exam 12/10/2021   Chronic pain of both shoulders 11/04/2021   Biceps tendinitis 10/24/2021   Anxiety, generalized 10/04/2013   Environmental and seasonal allergies 10/04/1998   Past Medical History:  Diagnosis Date   Allergies    Allergy    Anxiety    Family history of adverse reaction to anesthesia    Mother - slow to wake   Hypertension    Mixed hyperlipidemia 01/12/2023   Sleep apnea    CPAP   Outpatient Encounter Medications as of 08/29/2024  Medication Sig   amLODipine -benazepril  (LOTREL) 5-10 MG capsule Take 1 capsule by mouth daily.   busPIRone  (BUSPAR ) 10 MG tablet Take 1 tablet (10 mg total) by mouth 2 (two) times daily.   Pitavastatin  Calcium  4 MG TABS Take 1 tablet (4 mg total) by mouth daily.   tirzepatide  5 MG/0.5ML injection vial Inject 5 mg into the skin once a week.   [DISCONTINUED] propranolol  (INDERAL ) 10 MG tablet TAKE 2 TABLETS BY MOUTH 2 TIMES DAILY AS NEEDED.   [DISCONTINUED] sertraline  (ZOLOFT ) 50 MG tablet Take 1 tablet (50 mg total) by mouth daily.   [DISCONTINUED] sildenafil  (VIAGRA ) 100 MG tablet TAKE 0.5-1 TABLETS BY MOUTH AS NEEDED FOR ERECTILE  DYSFUNCTION (FOR USE PRIOR TO SEXUAL ACTIVITY).   propranolol  (INDERAL ) 10 MG tablet TAKE 2 TABLETS BY MOUTH 2 TIMES DAILY AS NEEDED.   sertraline  (ZOLOFT ) 50 MG tablet Take 1 tablet (50 mg total) by mouth daily.   sildenafil  (VIAGRA ) 100 MG tablet TAKE 0.5-1 TABLETS BY MOUTH AS NEEDED FOR ERECTILE DYSFUNCTION (FOR USE PRIOR TO SEXUAL ACTIVITY).   [DISCONTINUED] benzonatate  (TESSALON ) 100 MG capsule Take 1 capsule (100 mg total) by mouth 3 (three) times daily as needed for cough.   [DISCONTINUED] fluticasone  (FLONASE ) 50 MCG/ACT nasal spray Place 2 sprays into both nostrils daily. (Patient not taking: Reported on 06/07/2024)   No facility-administered encounter medications on file as of 08/29/2024.   Past Surgical History:  Procedure Laterality Date   ADENOIDECTOMY     EAR TUBE REMOVAL     ETHMOIDECTOMY Left 06/26/2021   Procedure: ANTERIOR ETHMOIDECTOMY;  Surgeon: Milissa Hamming, MD;  Location: [facility];  Service: ENT;  Laterality: Left;  sleep apnea   FRONTAL SINUS EXPLORATION Left 06/26/2021   Procedure: FRONTAL SINUS EXPLORATION;  Surgeon: Milissa Hamming, MD;  Location: [facility];  Service: ENT;  Laterality: Left;   IMAGE GUIDED SINUS SURGERY N/A 06/26/2021   Procedure: IMAGE GUIDED SINUS SURGERY;  Surgeon: Milissa Hamming, MD;  Location: [facility];  Service: ENT;  Laterality: N/A;  need stryker disk placed disk on OR CHARGE nurse desk 10-11 kp  MAXILLARY ANTROSTOMY Bilateral 06/26/2021   Procedure: MAXILLARY ANTROSTOMY WITH TISSUE REMOVAL;  Surgeon: Milissa Hamming, MD;  Location: [facility];  Service: ENT;  Laterality: Bilateral;   NASAL SEPTOPLASTY W/ TURBINOPLASTY Bilateral 06/26/2021   Procedure: NASAL SEPTOPLASTY WITH INFERIOR TURBINATE REDUCTION;  Surgeon: Milissa Hamming, MD;  Location: [facility];  Service: ENT;  Laterality: Bilateral;   TYMPANOSTOMY TUBE PLACEMENT Bilateral    VASECTOMY  03/19/2022   WISDOM TOOTH  EXTRACTION      Discussed the use of AI scribe software for clinical note transcription with the patient, who gave verbal consent to proceed.   Virtual Visit via MyChart Video:   I connected with Justyce Baby on 08/29/2024 via MyChart Video and verified that I am speaking with the correct person using appropriate identifiers.   The limitations, risks, security and privacy concerns of performing an evaluation and management service by MyChart Video, including the higher likelihood of inaccurate diagnoses and treatments, and the availability of in person appointments were reviewed. The possible need of an additional face-to-face encounter for complete and high quality delivery of care was discussed. The patient was also made aware that there may be a patient responsible charge related to this service. The patient expressed understanding and wishes to proceed.  Provider location is in medical facility. Patient location is at their home, different from provider location. People involved in care of the patient during this telehealth encounter were myself, my nurse/medical assistant, and my front office/scheduling team member.  Objective findings:   General: Speaking full sentences, no audible heavy breathing. Sounds alert and appropriately interactive. Well-appearing. Face symmetric. Extraocular movements intact. Pupils equal and round. No nasal flaring or accessory muscle use visualized.  Independent interpretation of notes and tests performed by another provider:   None  Pertinent History, Exam, Impression, and Recommendations:   History of Present Illness Richard Donovan is a 38 year old male with obesity, obstructive sleep apnea, and hypertension who presents for follow-up of weight management and medication tolerance.  Weight management and tirzepatide  tolerance - Currently treated with tirzepatide  7.5 mg weekly for weight management - Previously escalated from 5 mg to 7.5 mg;  experienced transient nausea with each dose increase, more pronounced with initial escalation to 5 mg - Nausea was significant enough to require pausing to avoid vomiting, but no episodes of emesis - Suspects a concurrent viral illness may have contributed to initial nausea, as his wife and toddler were also ill - No ongoing or severe nausea - Has not required use of prescribed ondansetron  - Denies other significant adverse effects from tirzepatide   Hypertension monitoring - Monitors blood pressure at home - Recent blood pressure reading of 124/81 mmHg  Gastrointestinal function - Daily bowel movements - Uses fiber supplement as needed - No constipation or other gastrointestinal complications  Assessment and Plan Obesity - Educated on constipation risk, emphasized daily bowel movements. - Recommended increased water intake and fiber if constipation develops. - Advised monitoring for persistent nausea, tracking dietary triggers. - Reinforced ondansetron  use for nausea if needed.  Hypertension Hypertension well-controlled with home BP 124/81 mmHg, no symptoms. - Reviewed home BP readings, confirmed control. - Deferred immediate visit, scheduled follow-up in three months. - Will coordinate next lab evaluation and physical exam at follow-up.  Generalized anxiety disorder Generalized anxiety disorder controlled on buspirone  10 mg twice daily, effective symptom control. - Sent prescription for buspirone  10 mg twice daily. - Confirmed efficacy and tolerability of buspirone . - Scheduled follow-up in three months  for anxiety management reassessment.  Problem List Items Addressed This Visit     Anxiety, generalized   Relevant Medications   busPIRone  (BUSPAR ) 10 MG tablet   sertraline  (ZOLOFT ) 50 MG tablet   propranolol  (INDERAL ) 10 MG tablet   Class 2 obesity with alveolar hypoventilation, serious comorbidity, and body mass index (BMI) of 36.0 to 36.9 in adult [facility]) - Primary    Hypertension   Relevant Medications   propranolol  (INDERAL ) 10 MG tablet   sildenafil  (VIAGRA ) 100 MG tablet   Low libido   Relevant Medications   sildenafil  (VIAGRA ) 100 MG tablet   Other Visit Diagnoses       Anxiety and depression       Relevant Medications   busPIRone  (BUSPAR ) 10 MG tablet   sertraline  (ZOLOFT ) 50 MG tablet        Orders & Medications Medications:  Meds ordered this encounter  Medications   busPIRone  (BUSPAR ) 10 MG tablet    Sig: Take 1 tablet (10 mg total) by mouth 2 (two) times daily.    Dispense:  180 tablet    Refill:  0   sertraline  (ZOLOFT ) 50 MG tablet    Sig: Take 1 tablet (50 mg total) by mouth daily.    Dispense:  90 tablet    Refill:  0   propranolol  (INDERAL ) 10 MG tablet    Sig: TAKE 2 TABLETS BY MOUTH 2 TIMES DAILY AS NEEDED.    Dispense:  360 tablet    Refill:  0   sildenafil  (VIAGRA ) 100 MG tablet    Sig: TAKE 0.5-1 TABLETS BY MOUTH AS NEEDED FOR ERECTILE DYSFUNCTION (FOR USE PRIOR TO SEXUAL ACTIVITY).    Dispense:  8 tablet    Refill:  5   No orders of the defined types were placed in this encounter.    I discussed the above assessment and treatment plan with the patient. The patient was provided an opportunity to ask questions and all were answered. The patient agreed with the plan and demonstrated an understanding of the instructions.   The patient was advised to call back or seek an in-person evaluation if the symptoms worsen or if the condition fails to improve as anticipated.   I provided a total time of 30 minutes including both face-to-face and non-face-to-face time on 08/29/2024 inclusive of time utilized for medical chart review, information gathering, care coordination with staff, and documentation completion.    Selinda JINNY Ku, MD, [facility]   Primary Care Sports Medicine Primary Care and Sports Medicine at [facility]   "

## 2024-08-29 NOTE — Patient Instructions (Signed)
 VISIT SUMMARY:  Today, we discussed your weight management, blood pressure, and anxiety. Your current medications are working well, and we reviewed some strategies to manage potential side effects.  YOUR PLAN:  OBESITY: You are currently managing your weight with tirzepatide  and have experienced some nausea with dose increases. -Continue taking tirzepatide  7.5 mg weekly. -Increase water intake and use fiber supplements if you experience constipation. -Monitor for persistent nausea and track any dietary triggers. -Use ondansetron  if you experience significant nausea.  HYPERTENSION: Your blood pressure is well-controlled with recent readings of 124/81 mmHg. -Continue monitoring your blood pressure at home. -We will have a follow-up visit in three months to review your blood pressure and conduct a lab evaluation and physical exam.  GENERALIZED ANXIETY DISORDER: Your anxiety is well-managed with buspirone  10 mg twice daily. -Continue taking buspirone  10 mg twice daily. -We will reassess your anxiety management in three months.

## 2024-09-15 ENCOUNTER — Ambulatory Visit: Admitting: Family Medicine

## 2024-11-28 ENCOUNTER — Ambulatory Visit: Admitting: Family Medicine
